# Patient Record
Sex: Female | Born: 1975 | Race: White | Hispanic: No | Marital: Married | Smoking: Current some day smoker
Health system: Southern US, Community
[De-identification: ages and names within clinical notes are randomized; demographics above are authoritative.]

## PROBLEM LIST (undated history)

## (undated) DIAGNOSIS — F329 Major depressive disorder, single episode, unspecified: Secondary | ICD-10-CM

## (undated) DIAGNOSIS — F419 Anxiety disorder, unspecified: Secondary | ICD-10-CM

## (undated) DIAGNOSIS — F32A Depression, unspecified: Secondary | ICD-10-CM

## (undated) DIAGNOSIS — K859 Acute pancreatitis without necrosis or infection, unspecified: Secondary | ICD-10-CM

## (undated) DIAGNOSIS — M419 Scoliosis, unspecified: Secondary | ICD-10-CM

## (undated) DIAGNOSIS — F429 Obsessive-compulsive disorder, unspecified: Secondary | ICD-10-CM

## (undated) DIAGNOSIS — G43909 Migraine, unspecified, not intractable, without status migrainosus: Secondary | ICD-10-CM

## (undated) DIAGNOSIS — F909 Attention-deficit hyperactivity disorder, unspecified type: Secondary | ICD-10-CM

## (undated) HISTORY — PX: TONSILLECTOMY: SUR1361

## (undated) HISTORY — PX: CHOLECYSTECTOMY: SHX55

---

## 1999-12-24 ENCOUNTER — Emergency Department (HOSPITAL_COMMUNITY): Admission: EM | Admit: 1999-12-24 | Discharge: 1999-12-24 | Payer: Self-pay | Admitting: Emergency Medicine

## 2003-10-08 ENCOUNTER — Other Ambulatory Visit: Admission: RE | Admit: 2003-10-08 | Discharge: 2003-10-08 | Payer: Self-pay | Admitting: Obstetrics and Gynecology

## 2004-02-04 ENCOUNTER — Ambulatory Visit (HOSPITAL_COMMUNITY): Admission: RE | Admit: 2004-02-04 | Discharge: 2004-02-04 | Payer: Self-pay | Admitting: Obstetrics and Gynecology

## 2004-04-21 ENCOUNTER — Inpatient Hospital Stay (HOSPITAL_COMMUNITY): Admission: AD | Admit: 2004-04-21 | Discharge: 2004-04-23 | Payer: Self-pay | Admitting: Obstetrics and Gynecology

## 2004-05-23 ENCOUNTER — Other Ambulatory Visit: Admission: RE | Admit: 2004-05-23 | Discharge: 2004-05-23 | Payer: Self-pay | Admitting: Obstetrics and Gynecology

## 2005-05-07 ENCOUNTER — Other Ambulatory Visit: Admission: RE | Admit: 2005-05-07 | Discharge: 2005-05-07 | Payer: Self-pay | Admitting: *Deleted

## 2005-09-30 ENCOUNTER — Encounter: Admission: RE | Admit: 2005-09-30 | Discharge: 2005-12-01 | Payer: Self-pay | Admitting: Family Medicine

## 2006-07-20 ENCOUNTER — Other Ambulatory Visit: Admission: RE | Admit: 2006-07-20 | Discharge: 2006-07-20 | Payer: Self-pay | Admitting: Family Medicine

## 2007-07-29 ENCOUNTER — Other Ambulatory Visit: Admission: RE | Admit: 2007-07-29 | Discharge: 2007-07-29 | Payer: Self-pay | Admitting: Family Medicine

## 2007-10-31 ENCOUNTER — Emergency Department (HOSPITAL_BASED_OUTPATIENT_CLINIC_OR_DEPARTMENT_OTHER): Admission: EM | Admit: 2007-10-31 | Discharge: 2007-10-31 | Payer: Self-pay | Admitting: Emergency Medicine

## 2008-08-07 ENCOUNTER — Other Ambulatory Visit: Admission: RE | Admit: 2008-08-07 | Discharge: 2008-08-07 | Payer: Self-pay | Admitting: Family Medicine

## 2009-05-02 ENCOUNTER — Ambulatory Visit (HOSPITAL_COMMUNITY): Admission: RE | Admit: 2009-05-02 | Discharge: 2009-05-02 | Payer: Self-pay | Admitting: Obstetrics and Gynecology

## 2009-06-11 ENCOUNTER — Encounter: Admission: RE | Admit: 2009-06-11 | Discharge: 2009-07-31 | Payer: Self-pay | Admitting: Obstetrics and Gynecology

## 2009-10-01 ENCOUNTER — Inpatient Hospital Stay (HOSPITAL_COMMUNITY)
Admission: AD | Admit: 2009-10-01 | Discharge: 2009-10-04 | Payer: Self-pay | Source: Home / Self Care | Admitting: Obstetrics and Gynecology

## 2009-11-16 ENCOUNTER — Inpatient Hospital Stay (HOSPITAL_COMMUNITY): Admission: EM | Admit: 2009-11-16 | Discharge: 2009-11-22 | Payer: Self-pay | Admitting: Emergency Medicine

## 2009-11-20 ENCOUNTER — Encounter (INDEPENDENT_AMBULATORY_CARE_PROVIDER_SITE_OTHER): Payer: Self-pay | Admitting: Internal Medicine

## 2009-11-20 ENCOUNTER — Ambulatory Visit: Payer: Self-pay | Admitting: Vascular Surgery

## 2009-11-21 ENCOUNTER — Encounter (INDEPENDENT_AMBULATORY_CARE_PROVIDER_SITE_OTHER): Payer: Self-pay | Admitting: Internal Medicine

## 2010-03-23 NOTE — L&D Delivery Note (Signed)
Preoperative diagnosis: 38+ week IUP, previous cesarean section, in labor, declines VBAC, request permanent sterilization.  Postoperative diagnosis: Same  Procedure: Repeat low transverse cesarean section, tubal ligation by Filshie clip application.  Surgeon: Marcelle Overlie  EBL: 600 cc  Specimens removed: Placenta to labor and delivery  Complications: None  Drains: Foley catheter  Procedure and findings:   The patient was taken to the operating room, after an adequate level of spinal anesthetic was obtained, with the legs in left tilt position the abdomen prepped and draped in the usual manner for sterile bowel procedures. Foley catheter inserted draining clear urine. Transverse incision made tibial scar which was reasonably well healed, this is carried down to the fascia which was incised and extended transversely. Rectus muscles were divided in the midline, peritoneum entered superiorly without incident and extended vertically. Elecsys retractor was positioned for better exposure. Bladder flap was created minimally and the bladder blade was inserted for better visualization transverse incision made in the lower segment extended with bandage scissors the patient then delivered of a healthy infant Apgars 910 the infant was suctioned cord clamped and passed to the pediatric team for further care. Placenta was delivered spontaneously intact, to labor and delivery. Uterus exteriorized, cavity wiped clean with a laparotomy pack closure obtained with the first layer of 0 chromic in a locked fashion followed by an imbricating layer of 0 chromic. The bladder flap area was intact and hemostatic at that point. Bilateral tubes and ovaries were normal, Babcock clamp was then used to grasp each tube it midsegment a Filshie clip was applied a right ankle 2-3 cm from the cornu, completely engulfing the tube. Excellent application on both sides.   Prior to closure sponge, needle, instrument counts reported as correct  x2. Peritoneum closed with a running 2-0 Vicryl suture. 2-0 Vicryl suture to used to reapproximate the rectus muscles in the midline. In a 0 PDS suture was then used to close the fascia from laterally to midline on either side. The subcutaneous tissue was less than 3 cm, was irrigated and noted be hemostatic subcuticular 4-0 Vicryl suture with good skin closure with this pressure dressing applied. She did receive Ancef 1 g IV preop and Pitocin IV after the cord was clamped clear urine noted at the end of the case. Mother and baby doing well at that point.  Dictated by [facility], not proofread.  Ashlee Player M. Milana Obey.D.

## 2010-06-05 LAB — HEPATIC FUNCTION PANEL
ALT: 66 U/L — ABNORMAL HIGH (ref 0–35)
Bilirubin, Direct: 0.2 mg/dL (ref 0.0–0.3)
Total Protein: 6 g/dL (ref 6.0–8.3)

## 2010-06-05 LAB — CBC
MCHC: 33.4 g/dL (ref 30.0–36.0)
MCV: 81.2 fL (ref 78.0–100.0)
Platelets: 271 10*3/uL (ref 150–400)
RBC: 3.55 MIL/uL — ABNORMAL LOW (ref 3.87–5.11)
RDW: 15.6 % — ABNORMAL HIGH (ref 11.5–15.5)
WBC: 7.1 10*3/uL (ref 4.0–10.5)

## 2010-06-05 LAB — DIFFERENTIAL
Basophils Absolute: 0.1 10*3/uL (ref 0.0–0.1)
Monocytes Relative: 4 % (ref 3–12)
Neutrophils Relative %: 66 % (ref 43–77)

## 2010-06-06 LAB — COMPREHENSIVE METABOLIC PANEL
ALT: 224 U/L — ABNORMAL HIGH (ref 0–35)
ALT: 94 U/L — ABNORMAL HIGH (ref 0–35)
AST: 19 U/L (ref 0–37)
AST: 53 U/L — ABNORMAL HIGH (ref 0–37)
Albumin: 2.7 g/dL — ABNORMAL LOW (ref 3.5–5.2)
Albumin: 3.3 g/dL — ABNORMAL LOW (ref 3.5–5.2)
Alkaline Phosphatase: 122 U/L — ABNORMAL HIGH (ref 39–117)
BUN: 13 mg/dL (ref 6–23)
BUN: 5 mg/dL — ABNORMAL LOW (ref 6–23)
BUN: 8 mg/dL (ref 6–23)
CO2: 27 mEq/L (ref 19–32)
Calcium: 8.2 mg/dL — ABNORMAL LOW (ref 8.4–10.5)
Calcium: 8.5 mg/dL (ref 8.4–10.5)
Creatinine, Ser: 0.61 mg/dL (ref 0.4–1.2)
Creatinine, Ser: 0.72 mg/dL (ref 0.4–1.2)
GFR calc Af Amer: >60 mL/min (ref >60)
GFR calc non Af Amer: >60 mL/min (ref >60)
Glucose, Bld: 105 mg/dL — ABNORMAL HIGH (ref 70–99)
Glucose, Bld: 109 mg/dL — ABNORMAL HIGH (ref 70–99)
Glucose, Bld: 90 mg/dL (ref 70–99)
Potassium: 3.3 mEq/L — ABNORMAL LOW (ref 3.5–5.1)
Potassium: 4 mEq/L (ref 3.5–5.1)
Sodium: 138 mEq/L (ref 135–145)
Sodium: 140 mEq/L (ref 135–145)
Total Bilirubin: 4.2 mg/dL — ABNORMAL HIGH (ref 0.3–1.2)
Total Protein: 6.1 g/dL (ref 6.0–8.3)
Total Protein: 6.5 g/dL (ref 6.0–8.3)
Total Protein: 6.6 g/dL (ref 6.0–8.3)

## 2010-06-06 LAB — CBC
HCT: 37.8 % (ref 36.0–46.0)
HCT: 40.4 % (ref 36.0–46.0)
Hemoglobin: 10 g/dL — ABNORMAL LOW (ref 12.0–15.0)
Hemoglobin: 12.8 g/dL (ref 12.0–15.0)
Hemoglobin: 12.8 g/dL (ref 12.0–15.0)
MCH: 27.4 pg (ref 26.0–34.0)
MCH: 27.5 pg (ref 26.0–34.0)
MCH: 27.5 pg (ref 26.0–34.0)
MCH: 27.6 pg (ref 26.0–34.0)
MCHC: 33.5 g/dL (ref 30.0–36.0)
MCHC: 33.5 g/dL (ref 30.0–36.0)
MCHC: 33.8 g/dL (ref 30.0–36.0)
MCHC: 33.9 g/dL (ref 30.0–36.0)
MCHC: 34 g/dL (ref 30.0–36.0)
MCV: 80.4 fL (ref 78.0–100.0)
MCV: 81.8 fL (ref 78.0–100.0)
MCV: 81.8 fL (ref 78.0–100.0)
RBC: 3.67 MIL/uL — ABNORMAL LOW (ref 3.87–5.11)
RBC: 5.53 MIL/uL — ABNORMAL HIGH (ref 3.87–5.11)
RDW: 15.8 % — ABNORMAL HIGH (ref 11.5–15.5)
RDW: 16.1 % — ABNORMAL HIGH (ref 11.5–15.5)
RDW: 16.1 % — ABNORMAL HIGH (ref 11.5–15.5)
WBC: 12.1 10*3/uL — ABNORMAL HIGH (ref 4.0–10.5)
WBC: 13.8 10*3/uL — ABNORMAL HIGH (ref 4.0–10.5)
WBC: 17.5 10*3/uL — ABNORMAL HIGH (ref 4.0–10.5)

## 2010-06-06 LAB — DIFFERENTIAL
Basophils Relative: 0 % (ref 0–1)
Basophils Relative: 1 % (ref 0–1)
Eosinophils Absolute: 0.3 10*3/uL (ref 0.0–0.7)
Eosinophils Absolute: 0.3 10*3/uL (ref 0.0–0.7)
Eosinophils Relative: 5 % (ref 0–5)
Lymphocytes Relative: 9 % — ABNORMAL LOW (ref 12–46)
Lymphs Abs: 1.8 10*3/uL (ref 0.7–4.0)
Monocytes Relative: 5 % (ref 3–12)
Neutro Abs: 15.5 10*3/uL — ABNORMAL HIGH (ref 1.7–7.7)
Neutrophils Relative %: 65 % (ref 43–77)

## 2010-06-06 LAB — URINALYSIS, MICROSCOPIC ONLY
Hgb urine dipstick: NEGATIVE
Ketones, ur: NEGATIVE mg/dL
Leukocytes, UA: NEGATIVE
Protein, ur: NEGATIVE mg/dL
Urobilinogen, UA: 0.2 mg/dL (ref 0.0–1.0)

## 2010-06-06 LAB — POCT I-STAT, CHEM 8
Hemoglobin: 17.3 g/dL — ABNORMAL HIGH (ref 12.0–15.0)
Sodium: 140 mEq/L (ref 135–145)

## 2010-06-06 LAB — CULTURE, BLOOD (ROUTINE X 2): Culture: NO GROWTH

## 2010-06-06 LAB — LIPASE, BLOOD
Lipase: 365 U/L — ABNORMAL HIGH (ref 11–59)
Lipase: 60 U/L — ABNORMAL HIGH (ref 11–59)

## 2010-06-06 LAB — RAPID URINE DRUG SCREEN, HOSP PERFORMED
Amphetamines: NOT DETECTED
Barbiturates: NOT DETECTED
Benzodiazepines: NOT DETECTED
Cocaine: NOT DETECTED
Opiates: POSITIVE — AB
Tetrahydrocannabinol: NOT DETECTED

## 2010-06-06 LAB — URINE CULTURE: Culture  Setup Time: 201108281146

## 2010-06-06 LAB — URINALYSIS, ROUTINE W REFLEX MICROSCOPIC
Glucose, UA: NEGATIVE mg/dL
Hgb urine dipstick: NEGATIVE
Nitrite: NEGATIVE
Protein, ur: NEGATIVE mg/dL
Urobilinogen, UA: 1 mg/dL (ref 0.0–1.0)
pH: 6 (ref 5.0–8.0)

## 2010-06-06 LAB — POCT PREGNANCY, URINE: Preg Test, Ur: NEGATIVE

## 2010-06-06 LAB — HEPATIC FUNCTION PANEL
Albumin: 4 g/dL (ref 3.5–5.2)
Total Protein: 7.8 g/dL (ref 6.0–8.3)

## 2010-06-08 LAB — CBC
HCT: 31.7 % — ABNORMAL LOW (ref 36.0–46.0)
HCT: 35.5 % — ABNORMAL LOW (ref 36.0–46.0)
MCH: 29.1 pg (ref 26.0–34.0)
MCH: 30.1 pg (ref 26.0–34.0)
MCHC: 34.5 g/dL (ref 30.0–36.0)
MCHC: 34.8 g/dL (ref 30.0–36.0)
MCV: 84.6 fL (ref 78.0–100.0)
MCV: 86.3 fL (ref 78.0–100.0)
Platelets: 220 10*3/uL (ref 150–400)
Platelets: 273 10*3/uL (ref 150–400)
RDW: 14.3 % (ref 11.5–15.5)
RDW: 14.8 % (ref 11.5–15.5)
WBC: 11.6 10*3/uL — ABNORMAL HIGH (ref 4.0–10.5)

## 2010-06-08 LAB — RH IMMUNE GLOB WKUP(>/=20WKS)(NOT WOMEN'S HOSP)

## 2010-06-08 LAB — ABO/RH: ABO/RH(D): A NEG

## 2010-07-10 ENCOUNTER — Other Ambulatory Visit (HOSPITAL_COMMUNITY): Payer: Self-pay | Admitting: Obstetrics and Gynecology

## 2010-07-10 DIAGNOSIS — Z0489 Encounter for examination and observation for other specified reasons: Secondary | ICD-10-CM

## 2010-07-16 ENCOUNTER — Ambulatory Visit (HOSPITAL_COMMUNITY): Payer: Self-pay

## 2010-07-21 ENCOUNTER — Ambulatory Visit (HOSPITAL_COMMUNITY)
Admission: RE | Admit: 2010-07-21 | Discharge: 2010-07-21 | Disposition: A | Discharge status: Home / Self Care | Payer: 59 | Source: Ambulatory Visit | Attending: Obstetrics and Gynecology | Admitting: Obstetrics and Gynecology

## 2010-07-21 DIAGNOSIS — E079 Disorder of thyroid, unspecified: Secondary | ICD-10-CM | POA: Insufficient documentation

## 2010-07-21 DIAGNOSIS — Z0489 Encounter for examination and observation for other specified reasons: Secondary | ICD-10-CM

## 2010-07-21 DIAGNOSIS — Z1389 Encounter for screening for other disorder: Secondary | ICD-10-CM | POA: Insufficient documentation

## 2010-07-21 DIAGNOSIS — O358XX0 Maternal care for other (suspected) fetal abnormality and damage, not applicable or unspecified: Secondary | ICD-10-CM | POA: Insufficient documentation

## 2010-07-21 DIAGNOSIS — O34219 Maternal care for unspecified type scar from previous cesarean delivery: Secondary | ICD-10-CM | POA: Insufficient documentation

## 2010-07-21 DIAGNOSIS — E039 Hypothyroidism, unspecified: Secondary | ICD-10-CM | POA: Insufficient documentation

## 2010-07-21 DIAGNOSIS — Z363 Encounter for antenatal screening for malformations: Secondary | ICD-10-CM | POA: Insufficient documentation

## 2010-11-05 ENCOUNTER — Inpatient Hospital Stay (HOSPITAL_COMMUNITY): Payer: 59 | Admitting: Anesthesiology

## 2010-11-05 ENCOUNTER — Inpatient Hospital Stay (HOSPITAL_COMMUNITY)
Admission: AD | Admit: 2010-11-05 | Discharge: 2010-11-08 | DRG: 766 | Disposition: A | Discharge status: Home / Self Care | Payer: 59 | Source: Ambulatory Visit | Attending: Obstetrics and Gynecology | Admitting: Obstetrics and Gynecology

## 2010-11-05 ENCOUNTER — Encounter (HOSPITAL_COMMUNITY)
Admission: AD | Disposition: A | Discharge status: Home / Self Care | Payer: Self-pay | Source: Ambulatory Visit | Attending: Obstetrics and Gynecology

## 2010-11-05 ENCOUNTER — Encounter (HOSPITAL_COMMUNITY): Payer: Self-pay | Admitting: Anesthesiology

## 2010-11-05 ENCOUNTER — Encounter (HOSPITAL_COMMUNITY): Payer: Self-pay | Admitting: *Deleted

## 2010-11-05 DIAGNOSIS — M545 Low back pain: Secondary | ICD-10-CM

## 2010-11-05 DIAGNOSIS — Z302 Encounter for sterilization: Secondary | ICD-10-CM

## 2010-11-05 DIAGNOSIS — O34219 Maternal care for unspecified type scar from previous cesarean delivery: Principal | ICD-10-CM | POA: Diagnosis present

## 2010-11-05 HISTORY — PX: TUBAL LIGATION: SHX77

## 2010-11-05 LAB — CBC
HCT: 37 % (ref 36.0–46.0)
Hemoglobin: 11.7 g/dL — ABNORMAL LOW (ref 12.0–15.0)
MCHC: 31.6 g/dL (ref 30.0–36.0)
RBC: 4.5 MIL/uL (ref 3.87–5.11)
WBC: 10.6 10*3/uL — ABNORMAL HIGH (ref 4.0–10.5)

## 2010-11-05 SURGERY — Surgical Case
Anesthesia: Regional | Site: Abdomen

## 2010-11-05 MED ORDER — KETOROLAC TROMETHAMINE 60 MG/2ML IM SOLN
INTRAMUSCULAR | Status: AC
  Administered 2010-11-05: 60 mg via INTRAMUSCULAR
  Filled 2010-11-05: qty 2

## 2010-11-05 MED ORDER — SIMETHICONE 80 MG PO CHEW: 80.0000 mg | CHEWABLE_TABLET | ORAL | Status: DC | PRN

## 2010-11-05 MED ORDER — DIPHENHYDRAMINE HCL 25 MG PO CAPS: 25.0000 mg | ORAL_CAPSULE | ORAL | Status: DC | PRN

## 2010-11-05 MED ORDER — TETANUS-DIPHTH-ACELL PERTUSSIS 5-2.5-18.5 LF-MCG/0.5 IM SUSP
0.5000 mL | Freq: Once | INTRAMUSCULAR | Status: AC
  Administered 2010-11-06: 0.5 mL via INTRAMUSCULAR
  Filled 2010-11-05: qty 0.5

## 2010-11-05 MED ORDER — BISACODYL 10 MG RE SUPP: 10.0000 mg | Freq: Every day | RECTAL | Status: DC | PRN

## 2010-11-05 MED ORDER — OXYCODONE-ACETAMINOPHEN 5-325 MG PO TABS
1.0000 | ORAL_TABLET | Freq: Four times a day (QID) | ORAL | Status: DC | PRN
  Administered 2010-11-05 – 2010-11-06 (×3): 2 via ORAL
  Administered 2010-11-06: 1 via ORAL
  Filled 2010-11-05 (×4): qty 2

## 2010-11-05 MED ORDER — WITCH HAZEL-GLYCERIN EX PADS: 1.0000 "application " | MEDICATED_PAD | CUTANEOUS | Status: DC | PRN

## 2010-11-05 MED ORDER — KETOROLAC TROMETHAMINE 30 MG/ML IJ SOLN
30.0000 mg | Freq: Four times a day (QID) | INTRAMUSCULAR | Status: DC | PRN
  Filled 2010-11-05: qty 1

## 2010-11-05 MED ORDER — HYDROMORPHONE HCL 1 MG/ML IJ SOLN
0.5000 mg | Freq: Once | INTRAMUSCULAR | Status: AC
  Administered 2010-11-05: 0.5 mg via INTRAVENOUS

## 2010-11-05 MED ORDER — FAMOTIDINE IN NACL 20-0.9 MG/50ML-% IV SOLN
20.0000 mg | Freq: Once | INTRAVENOUS | Status: AC
  Administered 2010-11-05: 20 mg via INTRAVENOUS
  Filled 2010-11-05: qty 50

## 2010-11-05 MED ORDER — MEPERIDINE HCL 25 MG/ML IJ SOLN: 6.2500 mg | INTRAMUSCULAR | Status: DC | PRN

## 2010-11-05 MED ORDER — PRENATAL PLUS 27-1 MG PO TABS
1.0000 | ORAL_TABLET | Freq: Every day | ORAL | Status: DC
  Administered 2010-11-06 – 2010-11-08 (×3): 1 via ORAL
  Filled 2010-11-05 (×3): qty 1

## 2010-11-05 MED ORDER — MEASLES, MUMPS & RUBELLA VAC ~~LOC~~ INJ
0.5000 mL | INJECTION | Freq: Once | SUBCUTANEOUS | Status: DC
  Filled 2010-11-05: qty 0.5

## 2010-11-05 MED ORDER — DIPHENHYDRAMINE HCL 50 MG/ML IJ SOLN: 12.5000 mg | INTRAMUSCULAR | Status: DC | PRN

## 2010-11-05 MED ORDER — NALOXONE HCL 0.4 MG/ML IJ SOLN: 0.4000 mg | INTRAMUSCULAR | Status: DC | PRN

## 2010-11-05 MED ORDER — KETOROLAC TROMETHAMINE 30 MG/ML IJ SOLN
30.0000 mg | Freq: Four times a day (QID) | INTRAMUSCULAR | Status: DC | PRN
  Administered 2010-11-05 (×2): 30 mg via INTRAVENOUS
  Filled 2010-11-05: qty 1

## 2010-11-05 MED ORDER — SCOPOLAMINE 1 MG/3DAYS TD PT72
1.0000 | MEDICATED_PATCH | Freq: Once | TRANSDERMAL | Status: AC
  Administered 2010-11-05: 1.5 mg via TRANSDERMAL

## 2010-11-05 MED ORDER — METOCLOPRAMIDE HCL 5 MG/ML IJ SOLN: 10.0000 mg | Freq: Three times a day (TID) | INTRAMUSCULAR | Status: DC | PRN

## 2010-11-05 MED ORDER — OXYTOCIN 20 UNITS IN LACTATED RINGERS INFUSION - SIMPLE
125.0000 mL/h | INTRAVENOUS | Status: AC
  Filled 2010-11-05: qty 1000

## 2010-11-05 MED ORDER — SODIUM CHLORIDE 0.9 % IV SOLN
1.0000 ug/kg/h | INTRAVENOUS | Status: DC | PRN
  Filled 2010-11-05: qty 2.5

## 2010-11-05 MED ORDER — EPHEDRINE SULFATE 50 MG/ML IJ SOLN
INTRAMUSCULAR | Status: DC | PRN
  Administered 2010-11-05: 20 mg via INTRAVENOUS
  Administered 2010-11-05: 10 mg via INTRAVENOUS
  Administered 2010-11-05: 15 mg via INTRAVENOUS

## 2010-11-05 MED ORDER — IBUPROFEN 600 MG PO TABS: 600.0000 mg | ORAL_TABLET | Freq: Four times a day (QID) | ORAL | Status: DC | PRN

## 2010-11-05 MED ORDER — NALBUPHINE SYRINGE 5 MG/0.5 ML
5.0000 mg | INJECTION | INTRAMUSCULAR | Status: DC | PRN
  Filled 2010-11-05: qty 1

## 2010-11-05 MED ORDER — FLEET ENEMA 7-19 GM/118ML RE ENEM: 1.0000 | ENEMA | RECTAL | Status: DC | PRN

## 2010-11-05 MED ORDER — OXYTOCIN 20 UNITS IN LACTATED RINGERS INFUSION - SIMPLE
INTRAVENOUS | Status: DC | PRN
  Administered 2010-11-05 (×2): 20 [IU] via INTRAVENOUS

## 2010-11-05 MED ORDER — ZOLPIDEM TARTRATE 5 MG PO TABS: 5.0000 mg | ORAL_TABLET | Freq: Every evening | ORAL | Status: DC | PRN

## 2010-11-05 MED ORDER — DIPHENHYDRAMINE HCL 50 MG/ML IJ SOLN: 25.0000 mg | INTRAMUSCULAR | Status: DC | PRN

## 2010-11-05 MED ORDER — MEPERIDINE HCL 25 MG/ML IJ SOLN
INTRAMUSCULAR | Status: DC | PRN
  Administered 2010-11-05: 25 mg via INTRAVENOUS

## 2010-11-05 MED ORDER — ONDANSETRON HCL 4 MG/2ML IJ SOLN: 4.0000 mg | Freq: Three times a day (TID) | INTRAMUSCULAR | Status: DC | PRN

## 2010-11-05 MED ORDER — SCOPOLAMINE 1 MG/3DAYS TD PT72
MEDICATED_PATCH | TRANSDERMAL | Status: AC
  Administered 2010-11-05: 1.5 mg via TRANSDERMAL
  Filled 2010-11-05: qty 1

## 2010-11-05 MED ORDER — CITRIC ACID-SODIUM CITRATE 334-500 MG/5ML PO SOLN
30.0000 mL | Freq: Once | ORAL | Status: AC
  Administered 2010-11-05: 30 mL via ORAL
  Filled 2010-11-05: qty 15

## 2010-11-05 MED ORDER — LANOLIN HYDROUS EX OINT: 1.0000 "application " | TOPICAL_OINTMENT | CUTANEOUS | Status: DC | PRN

## 2010-11-05 MED ORDER — KETOROLAC TROMETHAMINE 60 MG/2ML IM SOLN
60.0000 mg | Freq: Once | INTRAMUSCULAR | Status: AC | PRN
  Administered 2010-11-05: 60 mg via INTRAMUSCULAR

## 2010-11-05 MED ORDER — FENTANYL CITRATE 0.05 MG/ML IJ SOLN
INTRAMUSCULAR | Status: DC | PRN
  Administered 2010-11-05: 25 ug via INTRATHECAL

## 2010-11-05 MED ORDER — SODIUM CHLORIDE 0.9 % IJ SOLN: 3.0000 mL | INTRAMUSCULAR | Status: DC | PRN

## 2010-11-05 MED ORDER — SIMETHICONE 80 MG PO CHEW
80.0000 mg | CHEWABLE_TABLET | Freq: Three times a day (TID) | ORAL | Status: DC
  Administered 2010-11-05 – 2010-11-08 (×10): 80 mg via ORAL

## 2010-11-05 MED ORDER — DIBUCAINE 1 % RE OINT: 1.0000 "application " | TOPICAL_OINTMENT | RECTAL | Status: DC | PRN

## 2010-11-05 MED ORDER — CEFAZOLIN SODIUM 1-5 GM-% IV SOLN
1.0000 g | INTRAVENOUS | Status: AC
  Administered 2010-11-05: 1 g via INTRAVENOUS
  Filled 2010-11-05: qty 50

## 2010-11-05 MED ORDER — MORPHINE SULFATE (PF) 0.5 MG/ML IJ SOLN
INTRAMUSCULAR | Status: DC | PRN
  Administered 2010-11-05: 150 ug via INTRATHECAL

## 2010-11-05 MED ORDER — SENNOSIDES-DOCUSATE SODIUM 8.6-50 MG PO TABS
2.0000 | ORAL_TABLET | Freq: Every day | ORAL | Status: DC
  Administered 2010-11-05 – 2010-11-07 (×3): 2 via ORAL

## 2010-11-05 MED ORDER — PHENYLEPHRINE HCL 10 MG/ML IJ SOLN: INTRAMUSCULAR | Status: DC | PRN

## 2010-11-05 MED ORDER — LACTATED RINGERS IV SOLN
INTRAVENOUS | Status: DC
  Administered 2010-11-05 (×5): via INTRAVENOUS

## 2010-11-05 MED ORDER — ACETAMINOPHEN 10 MG/ML IV SOLN
1000.0000 mg | Freq: Four times a day (QID) | INTRAVENOUS | Status: DC | PRN
  Administered 2010-11-05: 1000 mg via INTRAVENOUS
  Filled 2010-11-05: qty 100

## 2010-11-05 MED ORDER — MENTHOL 3 MG MT LOZG: 1.0000 | LOZENGE | OROMUCOSAL | Status: DC | PRN

## 2010-11-05 MED ORDER — ONDANSETRON HCL 4 MG/2ML IJ SOLN
INTRAMUSCULAR | Status: DC | PRN
  Administered 2010-11-05: 4 mg via INTRAVENOUS

## 2010-11-05 MED ORDER — HYDROMORPHONE HCL 1 MG/ML IJ SOLN
INTRAMUSCULAR | Status: AC
  Administered 2010-11-05: 0.5 mg via INTRAVENOUS
  Filled 2010-11-05: qty 1

## 2010-11-05 MED ORDER — HYDROMORPHONE HCL 1 MG/ML IJ SOLN
0.5000 mg | Freq: Once | INTRAMUSCULAR | Status: AC
  Administered 2010-11-05: 0.5 mg via INTRAVENOUS
  Filled 2010-11-05: qty 1

## 2010-11-05 SURGICAL SUPPLY — 24 items
CLIP FILSHIE TUBAL LIGA STRL (Clip) ×1 IMPLANT
CLOTH BEACON ORANGE TIMEOUT ST (SAFETY) ×3 IMPLANT
DRESSING TELFA 8X3 (GAUZE/BANDAGES/DRESSINGS) IMPLANT
ELECT REM PT RETURN 9FT ADLT (ELECTROSURGICAL) ×3
ELECTRODE REM PT RTRN 9FT ADLT (ELECTROSURGICAL) ×2 IMPLANT
EXTRACTOR VACUUM M CUP 4 TUBE (SUCTIONS) IMPLANT
GAUZE SPONGE 4X4 12PLY STRL LF (GAUZE/BANDAGES/DRESSINGS) ×6 IMPLANT
GLOVE BIO SURGEON STRL SZ7 (GLOVE) ×6 IMPLANT
GOWN PREVENTION PLUS LG XLONG (DISPOSABLE) ×9 IMPLANT
KIT ABG SYR 3ML LUER SLIP (SYRINGE) IMPLANT
NDL HYPO 25X5/8 SAFETYGLIDE (NEEDLE) ×2 IMPLANT
NEEDLE HYPO 25X5/8 SAFETYGLIDE (NEEDLE) ×3 IMPLANT
NS IRRIG 1000ML POUR BTL (IV SOLUTION) ×3 IMPLANT
PACK C SECTION WH (CUSTOM PROCEDURE TRAY) ×3 IMPLANT
PAD ABD 7.5X8 STRL (GAUZE/BANDAGES/DRESSINGS) IMPLANT
SLEEVE SCD COMPRESS KNEE MED (MISCELLANEOUS) IMPLANT
SUT CHROMIC 0 CTX 36 (SUTURE) ×9 IMPLANT
SUT MON AB 4-0 PS1 27 (SUTURE) IMPLANT
SUT PDS AB 0 CT1 27 (SUTURE) ×6 IMPLANT
SUT VIC AB 3-0 CT1 27 (SUTURE) ×6
SUT VIC AB 3-0 CT1 TAPERPNT 27 (SUTURE) ×4 IMPLANT
TOWEL OR 17X24 6PK STRL BLUE (TOWEL DISPOSABLE) ×6 IMPLANT
TRAY FOLEY CATH 14FR (SET/KITS/TRAYS/PACK) ×3 IMPLANT
WATER STERILE IRR 1000ML POUR (IV SOLUTION) ×3 IMPLANT

## 2010-11-05 NOTE — Transfer of Care (Signed)
Immediate Anesthesia Transfer of Care Note  Patient: Katelyn Gray  Procedure(s) Performed:  CESAREAN SECTION; BILATERAL TUBAL LIGATION  Patient Location: PACU  Anesthesia Type: Spinal  Level of Consciousness: awake, alert  and oriented  Airway & Oxygen Therapy: Patient Spontanous Breathing  Post-op Assessment: Report given to PACU RN and Post -op Vital signs reviewed and stable  Post vital signs: Reviewed and stable  Complications: No apparent anesthesia complications

## 2010-11-05 NOTE — Anesthesia Postprocedure Evaluation (Signed)
  Anesthesia Post-op Note  Patient: Katelyn Gray  Procedure(s) Performed:  CESAREAN SECTION; BILATERAL TUBAL LIGATION  Patient Location: PACU  Anesthesia Type: Spinal  Level of Consciousness: awake, alert  and oriented  Airway and Oxygen Therapy: Patient Spontanous Breathing  Post-op Pain: 3 /10  Post-op Assessment: Post-op Vital signs reviewed, Patient's Cardiovascular Status Stable, Respiratory Function Stable, Patent Airway, No signs of Nausea or vomiting, Pain level controlled, No headache, No backache, No residual numbness and No residual motor weakness  Post-op Vital Signs: Reviewed and stable  Complications: No apparent anesthesia complications

## 2010-11-05 NOTE — H&P (Signed)
Nell Range  DICTATION # [id number] CSN# [id number]   Meriel Pica, MD 11/05/2010 4:51 AM

## 2010-11-05 NOTE — Progress Notes (Signed)
Difficulty tracing baby as pt continues to rock in bed.

## 2010-11-05 NOTE — Progress Notes (Signed)
Pt to OR via stretcher at this time

## 2010-11-05 NOTE — Progress Notes (Signed)
Pt presents to [facility] for labor check. Pt is scheduled for c/s on 8/21.

## 2010-11-05 NOTE — Progress Notes (Signed)
Addended by: Brayton Caves on: 11/05/2010 10:24 AM   Modules accepted: Orders

## 2010-11-05 NOTE — Progress Notes (Signed)
Dr. Marcelle Overlie at bedside.  Assessment done.  poc discussed with pt.

## 2010-11-05 NOTE — Anesthesia Preprocedure Evaluation (Addendum)
Anesthesia Evaluation  Name, MR# and DOB Patient awake  General Assessment Comment  Reviewed: Allergy & Precautions, H&P , Patient's Chart, lab work & pertinent test results  Airway Mallampati: II TM Distance: >3 FB Neck ROM: full    Dental No notable dental hx.    Pulmonary  Asthma: no recent hx of sx.  clear to auscultation  pulmonary exam normalPulmonary Exam Normal breath sounds clear to auscultation none    Cardiovascular Exercise Tolerance: Good regular Normal    Neuro/Psych   GI/Hepatic/Renal   Endo/Other  (+)   Morbid obesity  Abdominal   Musculoskeletal   Hematology   Peds  Reproductive/Obstetrics    Anesthesia Other Findings            Anesthesia Physical Anesthesia Plan  ASA: III  Anesthesia Plan: Spinal   Post-op Pain Management:    Induction:   Airway Management Planned:   Additional Equipment:   Intra-op Plan:   Post-operative Plan:   Informed Consent: I have reviewed the patients History and Physical, chart, labs and discussed the procedure including the risks, benefits and alternatives for the proposed anesthesia with the patient or authorized representative who has indicated his/her understanding and acceptance.   Dental Advisory Given  Plan Discussed with: CRNA  Anesthesia Plan Comments: (Lab work confirmed with CRNA in room. Platelets okay. Discussed spinal anesthetic, and patient consents to the procedure:  included risk of possible headache,backache, failed block, allergic reaction, and nerve injury. This patient was asked if she had any questions or concerns before the procedure started. )        Anesthesia Quick Evaluation

## 2010-11-05 NOTE — Anesthesia Procedure Notes (Signed)
Spinal Block  Staffing Anesthesiologist: Jiles Garter Additional Notes Spinal Dosage in OR  Bupivicaine ml       1.4 PFMS04   mcg        150 Fentanyl mcg            20

## 2010-11-05 NOTE — Progress Notes (Signed)
Dr. Marcelle Overlie notified of pt presenting for labor and a repeat c/s.  Notified of VE and fetal strip.  Orders received for IV, CBC and sign permit for c/s.

## 2010-11-06 ENCOUNTER — Other Ambulatory Visit (HOSPITAL_COMMUNITY): Payer: 59

## 2010-11-06 LAB — CBC
HCT: 29.2 % — ABNORMAL LOW (ref 36.0–46.0)
MCHC: 32.2 g/dL (ref 30.0–36.0)
Platelets: 212 10*3/uL (ref 150–400)
RDW: 16 % — ABNORMAL HIGH (ref 11.5–15.5)

## 2010-11-06 LAB — RH IG WORKUP (INCLUDES ABO/RH)
ABO/RH(D): A NEG
Unit division: 0

## 2010-11-06 MED ORDER — OXYCODONE-ACETAMINOPHEN 5-325 MG PO TABS
1.0000 | ORAL_TABLET | ORAL | Status: DC | PRN
Start: 2010-11-06 — End: 2010-11-08
  Administered 2010-11-06 – 2010-11-08 (×10): 2 via ORAL
  Filled 2010-11-06 (×10): qty 2

## 2010-11-06 MED ORDER — HYDROMORPHONE HCL 2 MG PO TABS
2.0000 mg | ORAL_TABLET | ORAL | Status: DC | PRN
  Administered 2010-11-06 (×3): 2 mg via ORAL
  Filled 2010-11-06 (×3): qty 1

## 2010-11-06 MED ORDER — IBUPROFEN 800 MG PO TABS
800.0000 mg | ORAL_TABLET | Freq: Three times a day (TID) | ORAL | Status: DC | PRN
  Administered 2010-11-06 – 2010-11-07 (×5): 800 mg via ORAL
  Filled 2010-11-06 (×6): qty 1

## 2010-11-06 MED ORDER — DIPHENHYDRAMINE HCL 25 MG PO CAPS: 25.0000 mg | ORAL_CAPSULE | Freq: Four times a day (QID) | ORAL | Status: DC | PRN

## 2010-11-06 NOTE — Progress Notes (Signed)
Subjective: Postpartum Day 1: Cesarean Delivery Patient reports incisional pain.  Has taken 2 Percocet and Motrin 800 without relief. Has taken Dilaudid with cholecystectomy previously with good control of pain.  Also complains of pain at epidural site  Objective: Vital signs in last 24 hours: Temp:  [97.4 F (36.3 C)-98.5 F (36.9 C)] 98.1 F (36.7 C) (08/16 0354) Pulse Rate:  [58-79] 79  (08/16 0354) Resp:  [18-23] 20  (08/16 0354) BP: (92-120)/(59-74) 103/71 mmHg (08/16 0354) SpO2:  [96 %-99 %] 97 % (08/16 0601)  Physical Exam:  General: cooperative, mild distress and mildly obese Lochia: appropriate Uterine Fundus: firm Abd dressing CDI, + BS Foley discontinued, voiding qs DVT Evaluation: No evidence of DVT seen on physical exam. No evidence of ecchymosis, erythema or edema at epidural site   [facility] 11/06/10 0540 11/05/10 0430  HGB 9.4* 11.7*  HCT 29.2* 37.0    Assessment/Plan: Status post Cesarean section. Postoperative pain  Continue current care Change pain med to Dilaudid 2 mg Ordered K-pad for back May shower, remove dressing.  Amaia Lavallie G 11/06/2010, 8:25 AM

## 2010-11-07 NOTE — Plan of Care (Signed)
Problem: Discharge Progression Outcomes Goal: MMR given as ordered Outcome: Progressing Awaiting rubella results

## 2010-11-07 NOTE — Progress Notes (Signed)
Subjective: Postpartum Day 2: Cesarean Delivery Patient reports tolerating PO, + flatus and no problems voiding.    Objective: Vital signs in last 24 hours: Temp:  [97.8 F (36.6 C)-98.1 F (36.7 C)] 97.8 F (36.6 C) (08/17 0500) Pulse Rate:  [76-87] 80  (08/17 0500) Resp:  [18-19] 18  (08/17 0500) BP: (104-110)/(69-75) 107/72 mmHg (08/17 0500)  Physical Exam:  General: alert and cooperative Lochia: appropriate Uterine Fundus: firm Incision: healing well, no significant erythema DVT Evaluation: No evidence of DVT seen on physical exam.   [facility] 11/06/10 0540 11/05/10 0430  HGB 9.4* 11.7*  HCT 29.2* 37.0    Assessment/Plan: Status post Cesarean section. Doing well postoperatively.  Continue current care.  Kolter Reaver G 11/07/2010, 8:27 AM

## 2010-11-08 MED ORDER — IBUPROFEN 800 MG PO TABS: 800.0000 mg | ORAL_TABLET | Freq: Three times a day (TID) | ORAL | Status: AC | PRN

## 2010-11-08 MED ORDER — OXYCODONE-ACETAMINOPHEN 5-325 MG PO TABS: 1.0000 | ORAL_TABLET | ORAL | Status: AC | PRN

## 2010-11-08 NOTE — Progress Notes (Signed)
Subjective: Postpartum Day 3: Cesarean Delivery Patient reports tolerating PO, + flatus and no problems voiding.    Objective: Vital signs in last 24 hours: Temp:  [98 F (36.7 C)-98.3 F (36.8 C)] 98 F (36.7 C) (08/18 0520) Pulse Rate:  [80-82] 80  (08/18 0520) Resp:  [18] 18  (08/18 0520) BP: (100-109)/(67-73) 100/67 mmHg (08/18 0520)  Physical Exam:  General: alert and cooperative Lochia: appropriate Uterine Fundus: firm Incision: healing well DVT Evaluation: No evidence of DVT seen on physical exam.   Basename 11/06/10 0540  HGB 9.4*  HCT 29.2*    Assessment/Plan: Status post Cesarean section.   Discharge home with standard precautions and return to clinic in 4-6 weeks.  Hallel Denherder II,Rowena Moilanen E 11/08/2010, 7:57 AM

## 2010-11-08 NOTE — Discharge Summary (Signed)
Obstetric Discharge Summary Reason for Admission: cesarean section Prenatal Procedures: none Intrapartum Procedures: cesarean: low cervical, transverse Postpartum Procedures: none Complications-Operative and Postpartum: none Hemoglobin  Date Value Range Status  11/06/2010 9.4* 12.0-15.0 (g/dL) Final     DELTA CHECK NOTED     REPEATED TO VERIFY     HCT  Date Value Range Status  11/06/2010 29.2* 36.0-46.0 (%) Final    Discharge Diagnoses: Term Pregnancy-delivered  Discharge Information: Date: 11/08/2010 Activity: pelvic rest Diet: routine Medications: PNV, Ibuprophen, Iron and Percocet Condition: stable Instructions: refer to practice specific booklet Discharge to: home Follow-up Information    Follow up with [facility]. Make an appointment in 2 weeks.   Contact information:   [address] [place] [address] [phone]          Newborn Data: Live born female  Birth Weight: 6 lb 8.9 oz (2975 g) APGAR: 8, 10  Home with mother.  Kera Deacon II,Laquincy Eastridge E 11/08/2010, 8:06 AM

## 2010-11-10 NOTE — H&P (Signed)
NAME:  KARLY, PITTER NO.:  MEDICAL RECORD NO.:  [id number]  LOCATION:                                 FACILITY:  PHYSICIAN:  Duke Salvia. Marcelle Overlie, M.D.DATE OF BIRTH:  10/24/1982  DATE OF ADMISSION: DATE OF DISCHARGE:                             HISTORY & PHYSICAL   CHIEF COMPLAINT:  Scheduled repeat cesarean section, in labor.  HISTORY OF PRESENT ILLNESS:  A 35 year old G40, P 3-0-2-3, EDD is November 18, 2010.  She has had one prior section for breech presentation, prior to that she has had two vaginal deliveries.  She is scheduled for RCS and tubal later this month, presents in early labor.  She is sure she does not want to try labor.  The procedure of repeat cesarean section, tubal ligation including risks related to bleeding, infection, transfusion, wound infection, phlebitis, permanence of tubal procedure and failure rated 2-3 per thousand all discussed with her which she understands and accepts.  Her history is significant for being Rh negative for history of anxiety and depression, history of hypothyroidism not currently on medications. Her prenatal thyroid labs have been normal.  GBS was negative.  PAST MEDICAL HISTORY:  Please see her [place] form for details.  ALLERGIES:  IMITREX.  PHYSICAL EXAMINATION:  VITAL SIGNS:  Temperature 98.2, blood pressure 120/78. HEENT: Unremarkable. NECK:  Supple without masses. LUNGS:  Clear. CARDIOVASCULAR:  Regular rate and rhythm without murmurs, rubs, gallops. BREASTS:  Not examined.  Term fundal height.  Fetal heart rate was 140. Cervix was 350, vertex -2, membranes intact.  EXTREMITIES: Unremarkable. NEUROLOGIC:  Unremarkable.  IMPRESSION: 1. Term intrauterine pregnancy, labor. 2. Prior cesarean section, declines vaginal birth after cesarean     section.  PLAN:  Repeat cesarean section and tubal ligation.  Procedure and risks reviewed as above.     Ahna Konkle M. Marcelle Overlie, M.D.     [facility]MED[facility]   D:  11/05/2010  T:  11/05/2010  Job:  [id number]

## 2010-11-11 ENCOUNTER — Inpatient Hospital Stay (HOSPITAL_COMMUNITY): Admission: RE | Admit: 2010-11-11 | Payer: 59 | Source: Ambulatory Visit | Admitting: Obstetrics and Gynecology

## 2010-11-11 ENCOUNTER — Encounter (HOSPITAL_COMMUNITY): Admission: RE | Payer: Self-pay | Source: Ambulatory Visit

## 2010-11-11 SURGERY — Surgical Case
Anesthesia: Regional | Laterality: Bilateral

## 2010-12-01 ENCOUNTER — Encounter (HOSPITAL_COMMUNITY): Payer: Self-pay | Admitting: Obstetrics and Gynecology

## 2011-02-24 ENCOUNTER — Encounter (HOSPITAL_BASED_OUTPATIENT_CLINIC_OR_DEPARTMENT_OTHER): Payer: Self-pay

## 2011-02-24 ENCOUNTER — Emergency Department (HOSPITAL_BASED_OUTPATIENT_CLINIC_OR_DEPARTMENT_OTHER)
Admission: EM | Admit: 2011-02-24 | Discharge: 2011-02-24 | Disposition: A | Discharge status: Home / Self Care | Payer: Medicaid Other | Attending: Emergency Medicine | Admitting: Emergency Medicine

## 2011-02-24 DIAGNOSIS — M25569 Pain in unspecified knee: Secondary | ICD-10-CM | POA: Insufficient documentation

## 2011-02-24 DIAGNOSIS — J45909 Unspecified asthma, uncomplicated: Secondary | ICD-10-CM | POA: Insufficient documentation

## 2011-02-24 DIAGNOSIS — R269 Unspecified abnormalities of gait and mobility: Secondary | ICD-10-CM | POA: Insufficient documentation

## 2011-02-24 DIAGNOSIS — M549 Dorsalgia, unspecified: Secondary | ICD-10-CM | POA: Insufficient documentation

## 2011-02-24 DIAGNOSIS — M25562 Pain in left knee: Secondary | ICD-10-CM

## 2011-02-24 HISTORY — DX: Acute pancreatitis without necrosis or infection, unspecified: K85.90

## 2011-02-24 HISTORY — DX: Scoliosis, unspecified: M41.9

## 2011-02-24 MED ORDER — HYDROCODONE-ACETAMINOPHEN 5-500 MG PO TABS: 1.0000 | ORAL_TABLET | ORAL | Status: AC | PRN

## 2011-02-24 MED ORDER — IBUPROFEN 400 MG PO TABS
600.0000 mg | ORAL_TABLET | Freq: Once | ORAL | Status: AC
  Administered 2011-02-24: 600 mg via ORAL
  Filled 2011-02-24: qty 1

## 2011-02-24 MED ORDER — OXYCODONE-ACETAMINOPHEN 5-325 MG PO TABS
1.0000 | ORAL_TABLET | Freq: Once | ORAL | Status: DC
  Filled 2011-02-24: qty 1

## 2011-02-24 NOTE — ED Notes (Signed)
MD at bedside. 

## 2011-02-24 NOTE — ED Provider Notes (Signed)
I saw and evaluated the patient, reviewed the resident's note and I agree with the findings and plan.  Suspect musculoskeletal and arthritic pain.  Recommended weight loss and pain medicine and close follow up with her primary care physician  Lyanne Co, MD 02/24/11 1535

## 2011-02-24 NOTE — ED Notes (Signed)
Pt reports low back pain and left knee pain that started Thursday.  Symptoms are unrelieved after taking Flexeril.

## 2011-02-24 NOTE — ED Provider Notes (Signed)
History     CSN: [id number] Arrival date & time: 02/24/2011  8:12 AM   First MD Initiated Contact with Patient 02/24/11 0801      Chief Complaint  Patient presents with  . Back Pain  . Knee Pain    (Consider location/radiation/quality/duration/timing/severity/associated sxs/prior treatment) Patient is a 35 y.o. female presenting with back pain and knee pain.  Back Pain  Pertinent negatives include no chest pain, no fever, no numbness, no headaches, no abdominal pain, no dysuria and no weakness.  Knee Pain Associated symptoms include arthralgias. Pertinent negatives include no abdominal pain, chest pain, chills, congestion, diaphoresis, fatigue, fever, headaches, joint swelling, myalgias, nausea, neck pain, numbness or weakness.    Katelyn Gray is a 35 year old woman with past medical history significant for chronic low back and left knee pain who presents with exacerbation of her osteoarthritis pain.  Katelyn Gray states that she has had a long battle with back and knee pain beginning in her teens. She states that she has been seen by [facility] who are her primary care doctors, but there is a problem with her Medicaid and she is unable to see them until the first of the year. She's not feel that her back pain and is an urgent issue however she does want to be seen prior to the first of the year. Katelyn Gray states that her current pain flare began on Thursday when she awoke after taking Flexeril the night before. She describes it as being located in her bilateral lower back. The duration has been on a daily basis with occasional flares. Character is aching pain with occasional electric shock radiations down her left buttock. It is aggravated by standing and walking. It is relieved by Flexeril and naproxen. Current severity is 0/10 while sitting. It is 7/10 while walking and 9/10 at its peak. Denies any red hot swollen joints. Denies any fevers or chills. Denies any weakness,  paresthesias, or numbness.    Past Medical History  Diagnosis Date  . Asthma   . Scoliosis   . Pancreatitis     Past Surgical History  Procedure Date  . Cesarean section   . Cholecystectomy   . Cesarean section 11/05/2010    Procedure: CESAREAN SECTION;  Surgeon: Meriel Pica;  Location: [facility];  Service: Gynecology;  Laterality: N/A;  . Tubal ligation 11/05/2010    Procedure: BILATERAL TUBAL LIGATION;  Surgeon: Meriel Pica;  Location: [facility];  Service: Gynecology;  Laterality: Bilateral;  . Tonsillectomy     No family history on file.  History  Substance Use Topics  . Smoking status: Never Smoker   . Smokeless tobacco: Not on file  . Alcohol Use: No    OB History    Grav Para Term Preterm Abortions TAB SAB Ect Mult Living   7 4 4  2 2    4       Review of Systems  Constitutional: Negative for fever, chills, diaphoresis, activity change and fatigue.  HENT: Negative for congestion, neck pain and neck stiffness.   Eyes: Negative for photophobia.  Respiratory: Negative for chest tightness and shortness of breath.   Cardiovascular: Negative for chest pain, palpitations and leg swelling.  Gastrointestinal: Negative for nausea, abdominal pain and blood in stool.  Genitourinary: Negative for dysuria and flank pain.  Musculoskeletal: Positive for back pain, arthralgias and gait problem. Negative for myalgias and joint swelling.  Skin: Negative for wound.  Neurological: Negative for dizziness, weakness, numbness and headaches.  Allergies  Imitrex  Home Medications   Current Outpatient Rx  Name Route Sig Dispense Refill  . FERROUS SULFATE 325 (65 FE) MG PO TABS Oral Take by mouth daily.      . TRAMADOL HCL 100 MG PO TB24 Oral Take by mouth as needed.      . ACETAMINOPHEN 325 MG PO TABS Oral Take 650 mg by mouth every 6 (six) hours as needed. For headache     . CYCLOBENZAPRINE HCL 10 MG PO TABS Oral Take 10 mg by mouth daily as needed. For back pain     .  PRENATAL PLUS 27-1 MG PO TABS Oral Take 1 tablet by mouth daily.        BP 143/90  Pulse 68  Temp(Src) 97.9 F (36.6 C) (Oral)  Resp 16  Ht 4\' 11"  (1.499 m)  Wt 210 lb (95.255 kg)  BMI 42.41 kg/m2  SpO2 100%  LMP 02/17/2011  Breastfeeding? No  Physical Exam  Constitutional: She is oriented to person, place, and time. No distress.  HENT:  Head: Normocephalic and atraumatic.  Mouth/Throat: Oropharynx is clear and moist. No oropharyngeal exudate.  Eyes: Conjunctivae and EOM are normal. Pupils are equal, round, and reactive to light. No scleral icterus.  Neck: Normal range of motion. Neck supple.  Cardiovascular: Normal rate, regular rhythm, normal heart sounds and intact distal pulses.  Exam reveals no gallop and no friction rub.   No murmur heard. Pulmonary/Chest: Effort normal and breath sounds normal. No respiratory distress. She has no wheezes. She has no rales. She exhibits no tenderness.  Abdominal: She exhibits no distension. There is no tenderness. There is no rebound and no guarding.  Musculoskeletal:       Left knee: She exhibits bony tenderness. She exhibits normal range of motion, no swelling, no effusion, no deformity, no laceration, no erythema, normal alignment, no LCL laxity, normal patellar mobility, normal meniscus and no MCL laxity. no tenderness found. No MCL and no LCL tenderness noted.       Lumbar back: She exhibits pain. She exhibits normal range of motion, no tenderness, no bony tenderness, no swelling, no edema, no deformity and no spasm.       Straight leg test is negative bilaterally.  Neurological: She is alert and oriented to person, place, and time. She displays normal reflexes. No cranial nerve deficit. She exhibits normal muscle tone.  Skin: Skin is warm and dry. No rash noted. She is not diaphoretic. No erythema.  Psychiatric: She has a normal mood and affect. Her behavior is normal. Judgment and thought content normal.    ED Course  Procedures  (including critical care time)  Labs Reviewed - No data to display No results found.   No diagnosis found.    MDM  Very likely an exacerbation of her chronic musculoskeletal pain. Recommended physical therapy and scheduled him tramadol and naproxen twice daily. She is to limit bending of the back and absolutely avoid bending while twisting. She is to lift with her legs and knees, not her back.  We will provide Vicodin on a when necessary basis. She was advised not to drive while taking this medication or Flexeril. She is to go see her primary care MD as soon as it is possible for further management of her back pain        Quentin Ore, MD 02/24/11 [phone]

## 2011-03-28 ENCOUNTER — Encounter (HOSPITAL_BASED_OUTPATIENT_CLINIC_OR_DEPARTMENT_OTHER): Payer: Self-pay

## 2011-03-28 ENCOUNTER — Emergency Department (HOSPITAL_BASED_OUTPATIENT_CLINIC_OR_DEPARTMENT_OTHER)
Admission: EM | Admit: 2011-03-28 | Discharge: 2011-03-28 | Disposition: A | Discharge status: Home / Self Care | Payer: Medicaid Other | Attending: Emergency Medicine | Admitting: Emergency Medicine

## 2011-03-28 DIAGNOSIS — M549 Dorsalgia, unspecified: Secondary | ICD-10-CM | POA: Insufficient documentation

## 2011-03-28 DIAGNOSIS — H109 Unspecified conjunctivitis: Secondary | ICD-10-CM | POA: Insufficient documentation

## 2011-03-28 DIAGNOSIS — J3489 Other specified disorders of nose and nasal sinuses: Secondary | ICD-10-CM | POA: Insufficient documentation

## 2011-03-28 DIAGNOSIS — J329 Chronic sinusitis, unspecified: Secondary | ICD-10-CM | POA: Insufficient documentation

## 2011-03-28 DIAGNOSIS — J45909 Unspecified asthma, uncomplicated: Secondary | ICD-10-CM | POA: Insufficient documentation

## 2011-03-28 MED ORDER — SULFACETAMIDE SODIUM 10 % OP SOLN
2.0000 [drp] | OPHTHALMIC | Status: DC
  Administered 2011-03-28: 2 [drp] via OPHTHALMIC
  Filled 2011-03-28: qty 15

## 2011-03-28 MED ORDER — HYDROCODONE-ACETAMINOPHEN 5-500 MG PO TABS: 1.0000 | ORAL_TABLET | Freq: Four times a day (QID) | ORAL | Status: AC | PRN

## 2011-03-28 MED ORDER — AMOXICILLIN 500 MG PO CAPS: 500.0000 mg | ORAL_CAPSULE | Freq: Three times a day (TID) | ORAL | Status: AC

## 2011-03-28 NOTE — ED Notes (Signed)
MD at bedside. 

## 2011-03-28 NOTE — ED Notes (Signed)
Pt reports a sore throat, nasal and head congestion, irritation to eyes, ear pain and sinus pressure x 1 week worsening the past 4 days.

## 2011-03-28 NOTE — ED Provider Notes (Signed)
History     CSN: [id number]  Arrival date & time 03/28/11  0807   First MD Initiated Contact with Patient 03/28/11 [phone]      Chief Complaint  Patient presents with  . Nasal Congestion  . Sore Throat  . Otalgia   patient is reporting sore throat, nasal congestion, eye irritation sinus pressure, and ear pain, over the past 4 days. No fevers. She states her eyes were crusted shut this morning. She's had a mild dry cough with no chest pain. No pleuritic pain. She is having diffuse sinus pressure and pain. She does have several children better at home, but none currently sick. Patient also reports a long-standing history of scoliosis and chronic back pain, currently out of her hydrocodone, requesting refill. Denies any abdominal pain, vomiting, or any urinary symptoms.  (Consider location/radiation/quality/duration/timing/severity/associated sxs/prior treatment) HPI  Past Medical History  Diagnosis Date  . Asthma   . Scoliosis   . Pancreatitis     Past Surgical History  Procedure Date  . Cesarean section   . Cholecystectomy   . Cesarean section 11/05/2010    Procedure: CESAREAN SECTION;  Surgeon: Meriel Pica;  Location: [facility];  Service: Gynecology;  Laterality: N/A;  . Tubal ligation 11/05/2010    Procedure: BILATERAL TUBAL LIGATION;  Surgeon: Meriel Pica;  Location: [facility];  Service: Gynecology;  Laterality: Bilateral;  . Tonsillectomy     No family history on file.  History  Substance Use Topics  . Smoking status: Never Smoker   . Smokeless tobacco: Not on file  . Alcohol Use: No    OB History    Grav Para Term Preterm Abortions TAB SAB Ect Mult Living   7 4 4  2 2    4       Review of Systems  All other systems reviewed and are negative.    Allergies  Imitrex  Home Medications   Current Outpatient Rx  Name Route Sig Dispense Refill  . ACETAMINOPHEN 325 MG PO TABS Oral Take 650 mg by mouth every 6 (six) hours as needed. For headache     .  CYCLOBENZAPRINE HCL 10 MG PO TABS Oral Take 10 mg by mouth daily as needed. For back pain     . FERROUS SULFATE 325 (65 FE) MG PO TABS Oral Take by mouth daily.      Marland Kitchen PRENATAL PLUS 27-1 MG PO TABS Oral Take 1 tablet by mouth daily.      . TRAMADOL HCL ER 100 MG PO TB24 Oral Take by mouth as needed.        BP 119/57  Pulse 71  Temp(Src) 98.8 F (37.1 C) (Oral)  Resp 18  Ht 4\' 11"  (1.499 m)  Wt 220 lb (99.791 kg)  BMI 44.43 kg/m2  SpO2 100%  LMP 03/07/2011  Physical Exam  Nursing note and vitals reviewed. Constitutional: She is oriented to person, place, and time. She appears well-developed and well-nourished. No distress.  HENT:  Head: Normocephalic and atraumatic.  Mouth/Throat: Oropharynx is clear and moist.       Mild bilateral clear to yellow conjunctival discharge from the eyes. Diffuse nasal congestion, and diffuse tenderness to percussion, to the bilateral maxillary sinuses. Mild erythema to the posterior throat, no abscess or odor. No adenopathy. Tympanic membranes show no redness, but mild dullness.  Eyes: Conjunctivae and EOM are normal. Pupils are equal, round, and reactive to light.  Neck: Neck supple. No thyromegaly present.  Cardiovascular: Normal rate and regular  rhythm.  Exam reveals no gallop and no friction rub.   No murmur heard. Pulmonary/Chest: Breath sounds normal. She has no wheezes. She has no rales. She exhibits no tenderness.  Abdominal: Soft. Bowel sounds are normal. She exhibits no distension. There is no tenderness. There is no rebound and no guarding.  Musculoskeletal: Normal range of motion.  Lymphadenopathy:    She has no cervical adenopathy.  Neurological: She is alert and oriented to person, place, and time. No cranial nerve deficit. Coordination normal.  Skin: Skin is warm and dry. No rash noted.  Psychiatric: She has a normal mood and affect.    ED Course  Procedures (including critical care time)  Labs Reviewed - No data to display No  results found.   No diagnosis found.    MDM  Treatment for sinusitis and conjunctivitis. Patient was given ultralight. He has 4 symptomatic care. She'll be discharged home in stable condition. Was encouraged to use meticulous handwashing to prevent the spread of the illness to her children at home. Followup primary doctor next week as needed.        Deidra Spease A. Patrica Duel, MD 03/28/11 [phone]

## 2011-08-16 IMAGING — CT CT ANGIO CHEST
2 of 6 series · 19 of 36 positions shown · IV contrast (APPLIED)
Comparison: None available.

CLINICAL DATA: Chest pain.

CT ANGIOGRAPHY CHEST WITH CONTRAST
TECHNIQUE: Multidetector CT imaging of the chest was performed
using the standard protocol during bolus administration of
intravenous contrast.  Multiplanar CT image reconstructions
including MIPs were obtained to evaluate the vascular anatomy.
Contrast:  100 ml Nmnipaque-SLL.

[Series 6: thins · axial · 0.70mm/px · z∈[-254,-43]mm · 18 of 235 slices shown]
[im 12/235  lung]
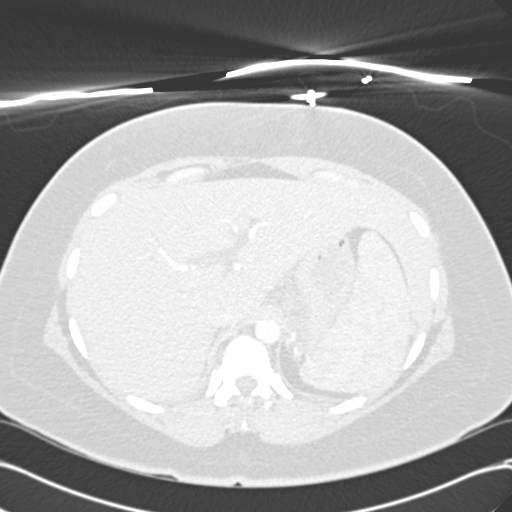
[im 24/235  mediastinal]
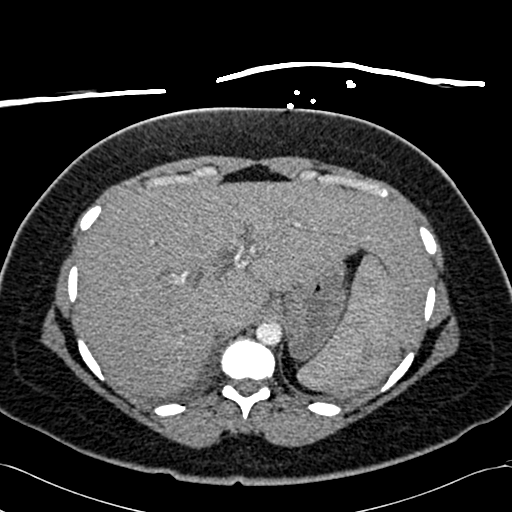
[im 36/235  lung]
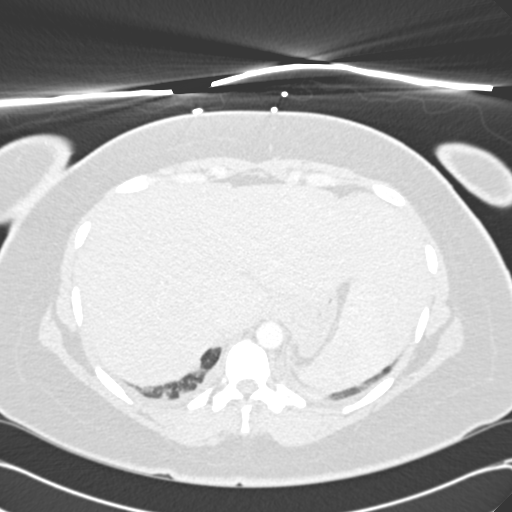
[im 47/235  mediastinal]
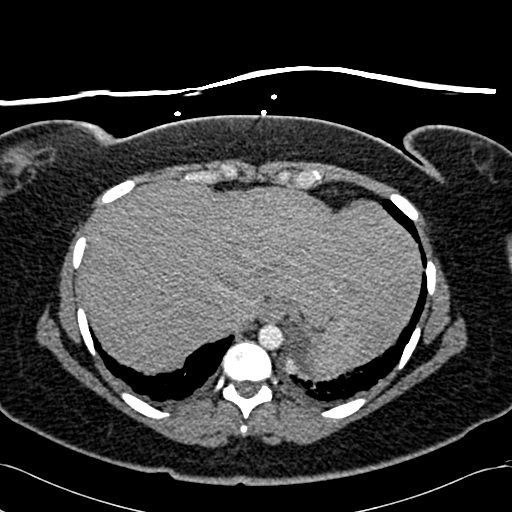
[im 59/235  lung]
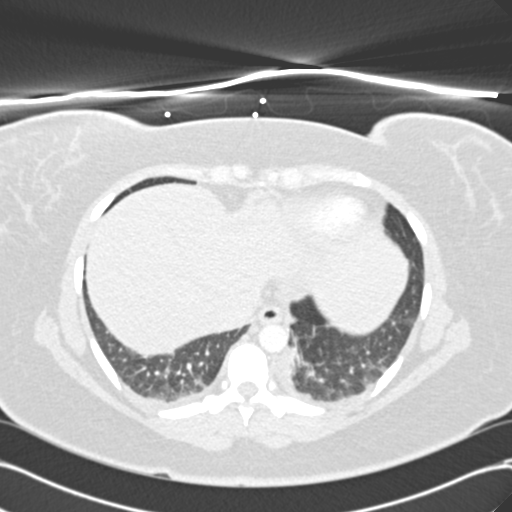
[im 71/235  mediastinal]
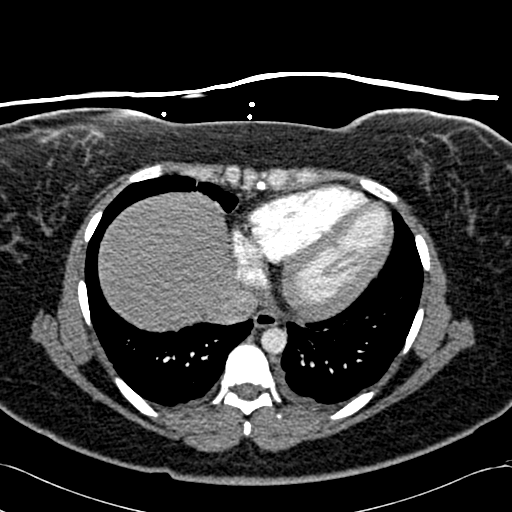
[im 82/235  lung]
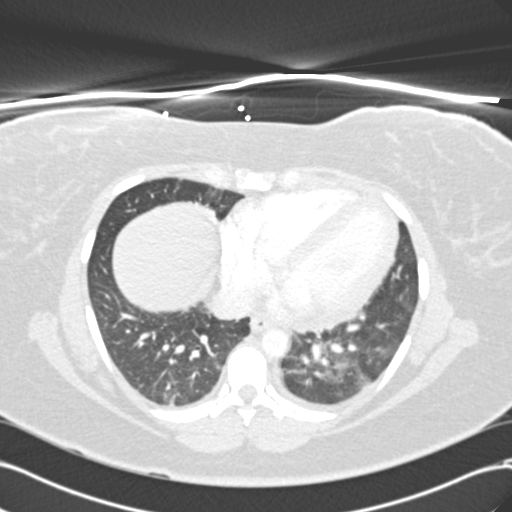
[im 94/235  mediastinal]
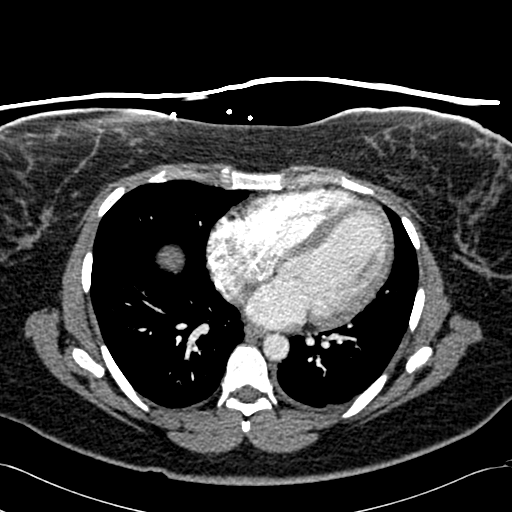
[im 106/235  lung]
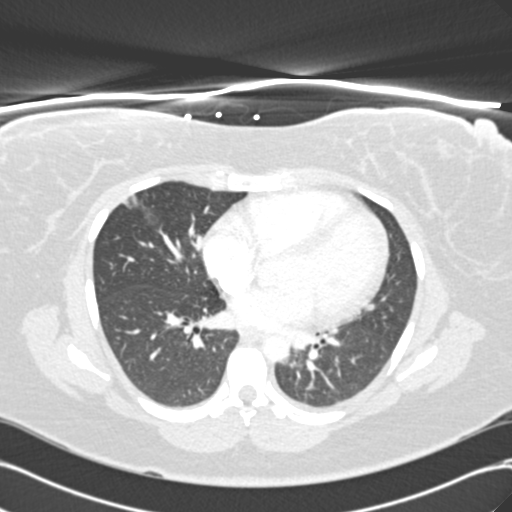
[im 129/235  mediastinal]
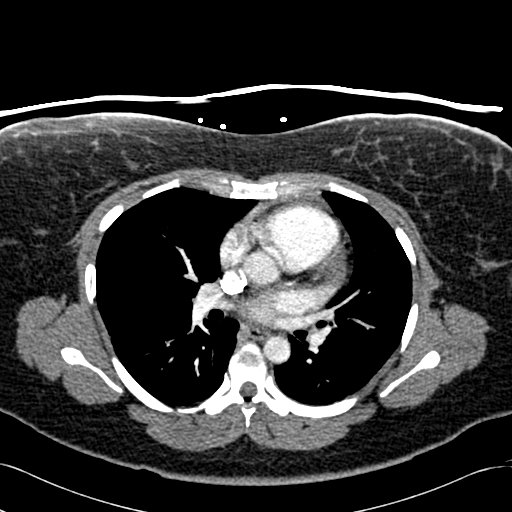
[im 141/235  lung]
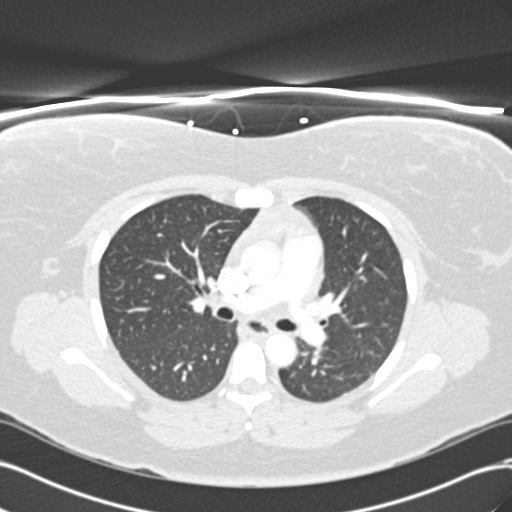
[im 153/235  mediastinal]
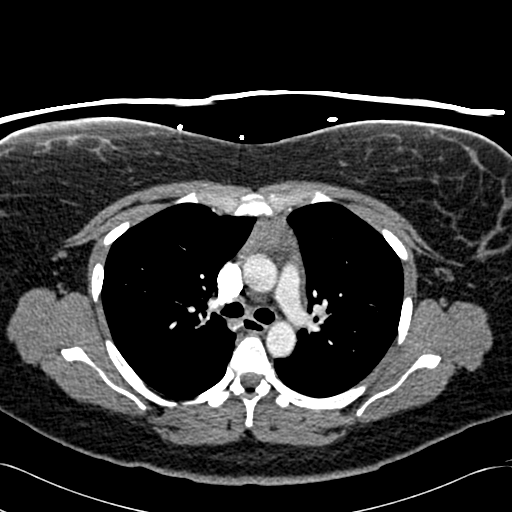
[im 164/235  lung]
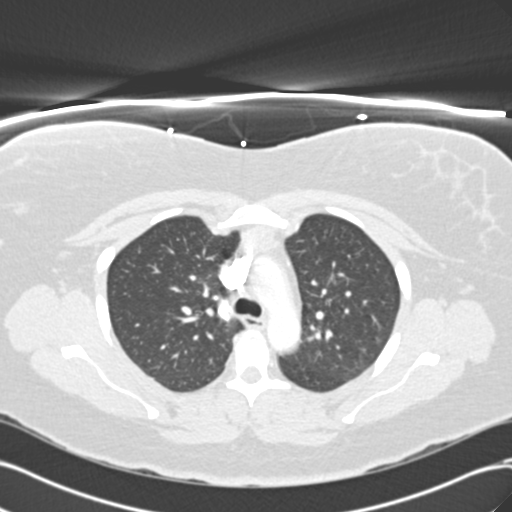
[im 176/235  mediastinal]
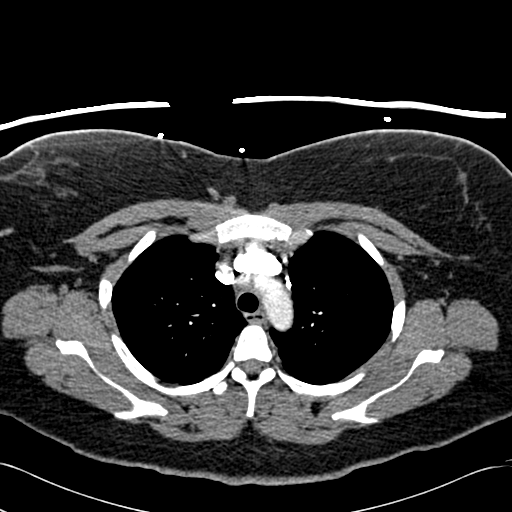
[im 188/235  lung]
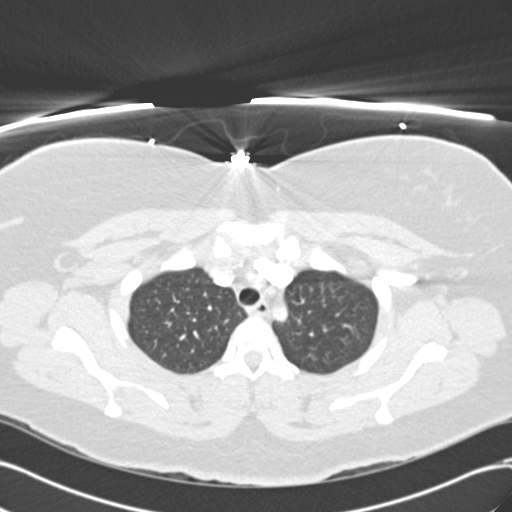
[im 199/235  mediastinal]
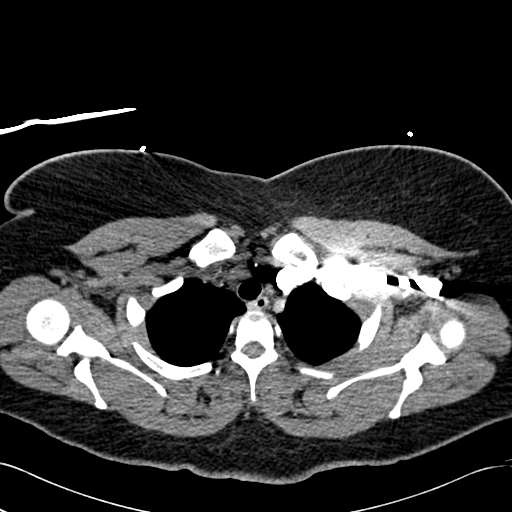
[im 211/235  lung]
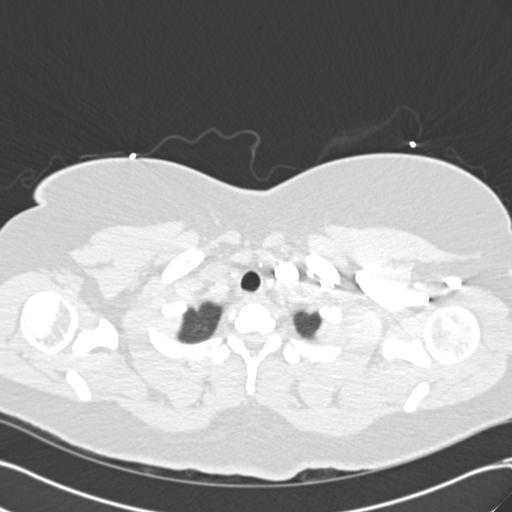
[im 223/235  mediastinal]
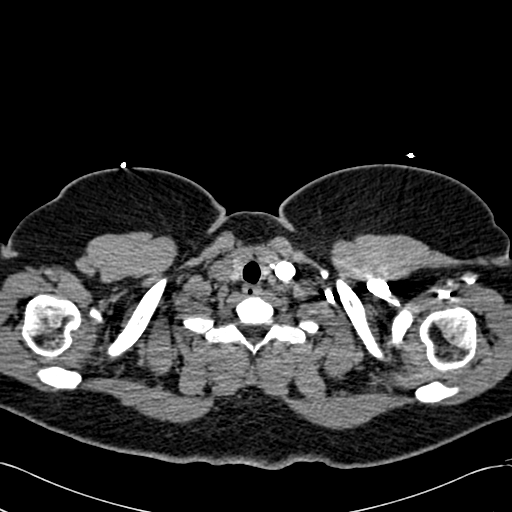

[Series 602: coronal chest · coronal · 0.70mm/px · 1 of 88 slices shown]
[im 44/88  mediastinal]
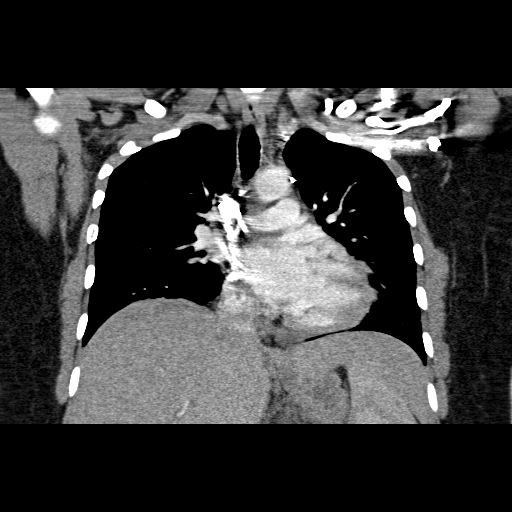

[19 of 36 positions shown; findings below may reference images not displayed]

FINDINGS: The chest wall is unremarkable.

The heart is normal in size.  No pericardial effusion.  No
mediastinal or hilar adenopathy.  The esophagus is grossly normal.
The aorta is normal in caliber.  No dissection.

The pulmonary arterial tree is fairly well opacified.  No definite
filling defects to suggest pulmonary emboli.

Examination of the lung parenchyma demonstrates no acute pulmonary
findings.  Minimal dependent bibasilar atelectasis.

Mild central intrahepatic ductal dilatation is suspected in the
upper abdomen and on the very last two cuts there appears to be an
inflammatory process in the lesser sac.  Pancreatitis would be a
possibility.  Recommend clinical correlation.  Abdominal CT may be
helpful for further evaluation if clinically indicated.

Review of the MIP images confirms the above findings.
IMPRESSION: 1.  Negative CT examination of the chest.  No findings for
pulmonary embolism.
2.  Suspect inflammatory process in the lesser sac.  Pancreatitis
would be a possibility.  Recommend clinical correlation.  Abdominal
CT may be helpful for further evaluation.

## 2011-08-21 IMAGING — RF DG CHOLANGIOGRAM OPERATIVE
1 series · 4 of 4 positions shown · non-contrast
Comparison: None

CLINICAL DATA: Gallstone pancreatitis

INTRAOPERATIVE CHOLANGIOGRAM
TECHNIQUE: Cholangiographic images from the C-arm fluoroscopic
device were submitted for interpretation post-operatively.  Please
see the procedural report for the amount of contrast and the
fluoroscopy time utilized.

[Series 1: run · 4 of 38 frames shown]
[frame 2/38]
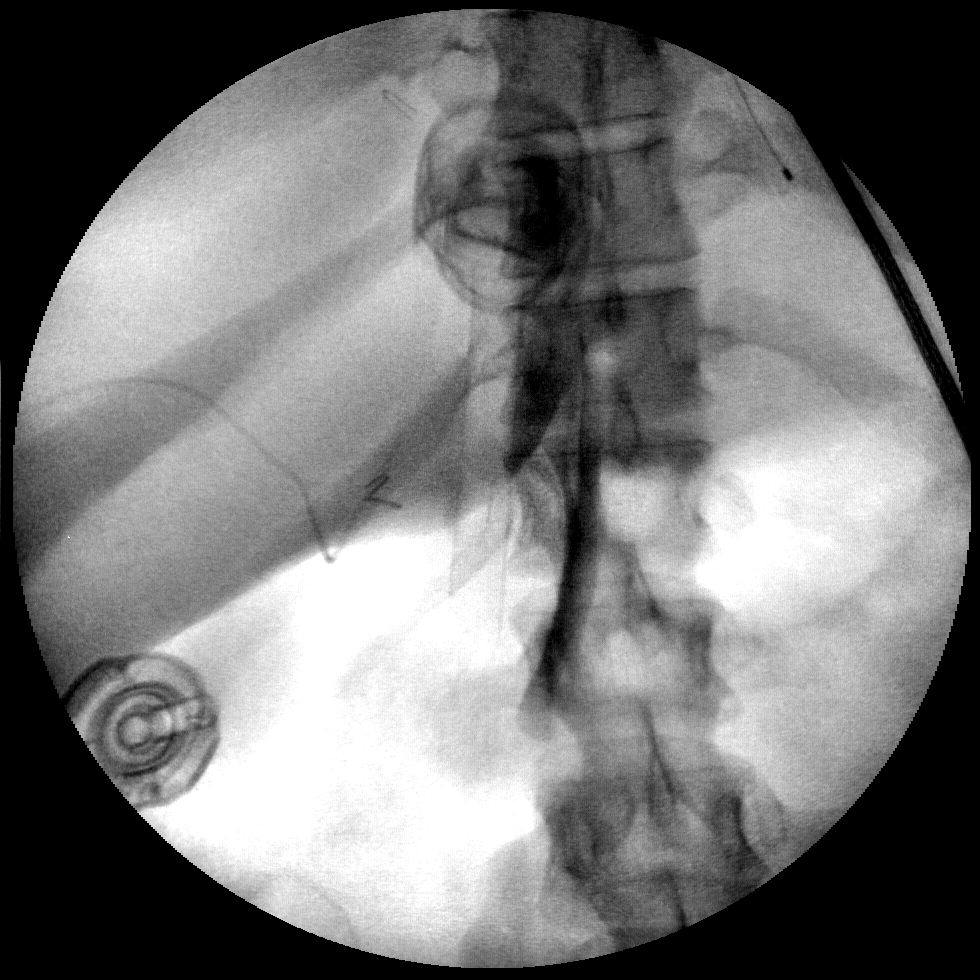
[frame 6/38]
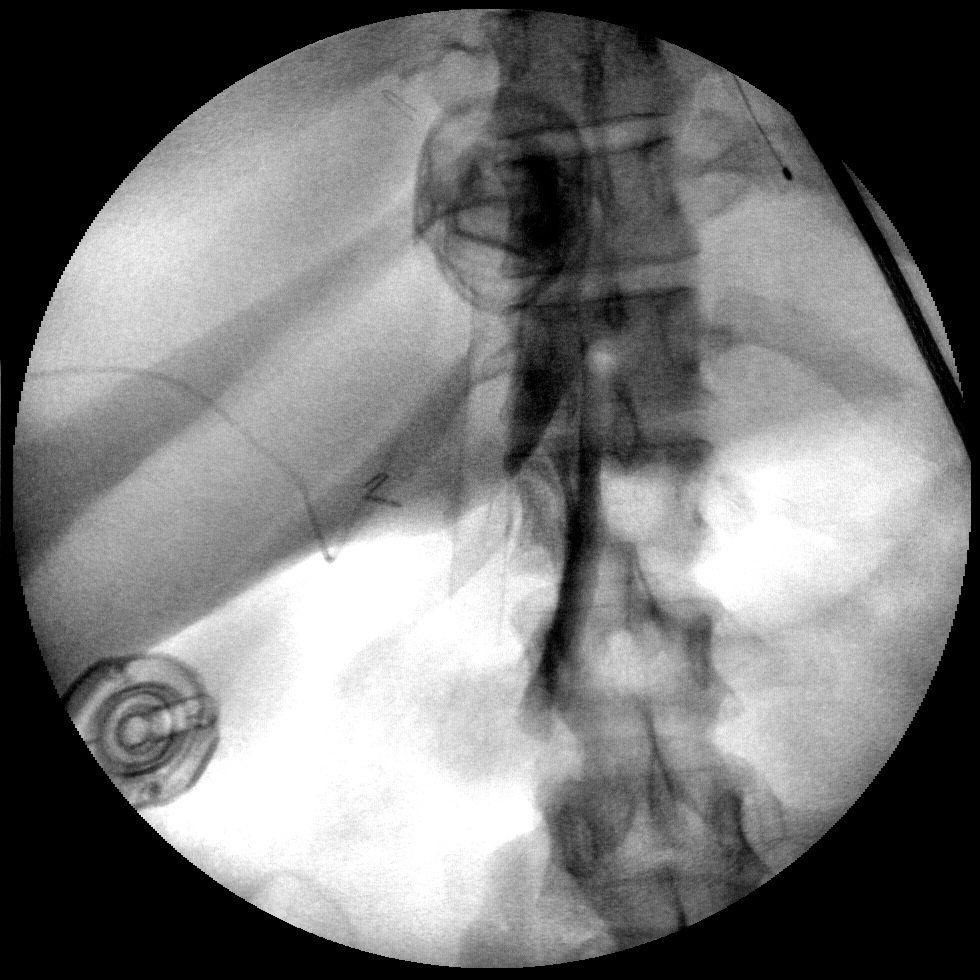
[frame 20/38]
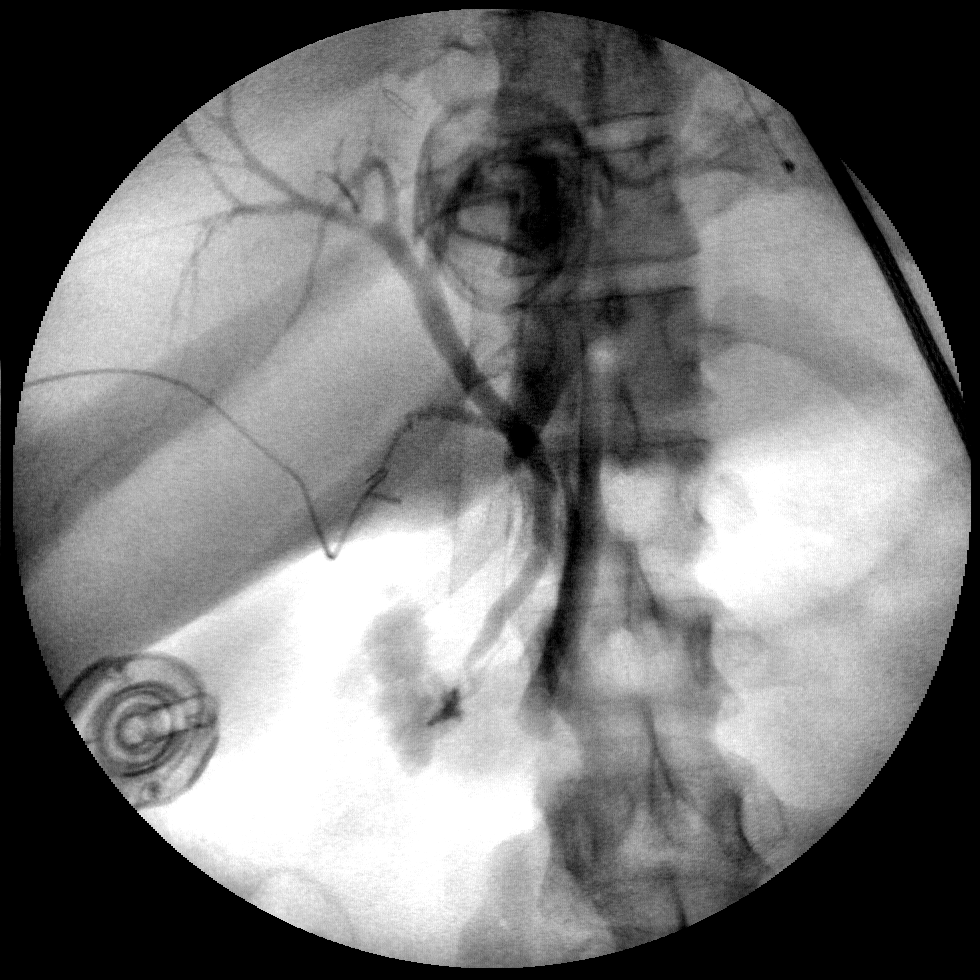
[frame 33/38]
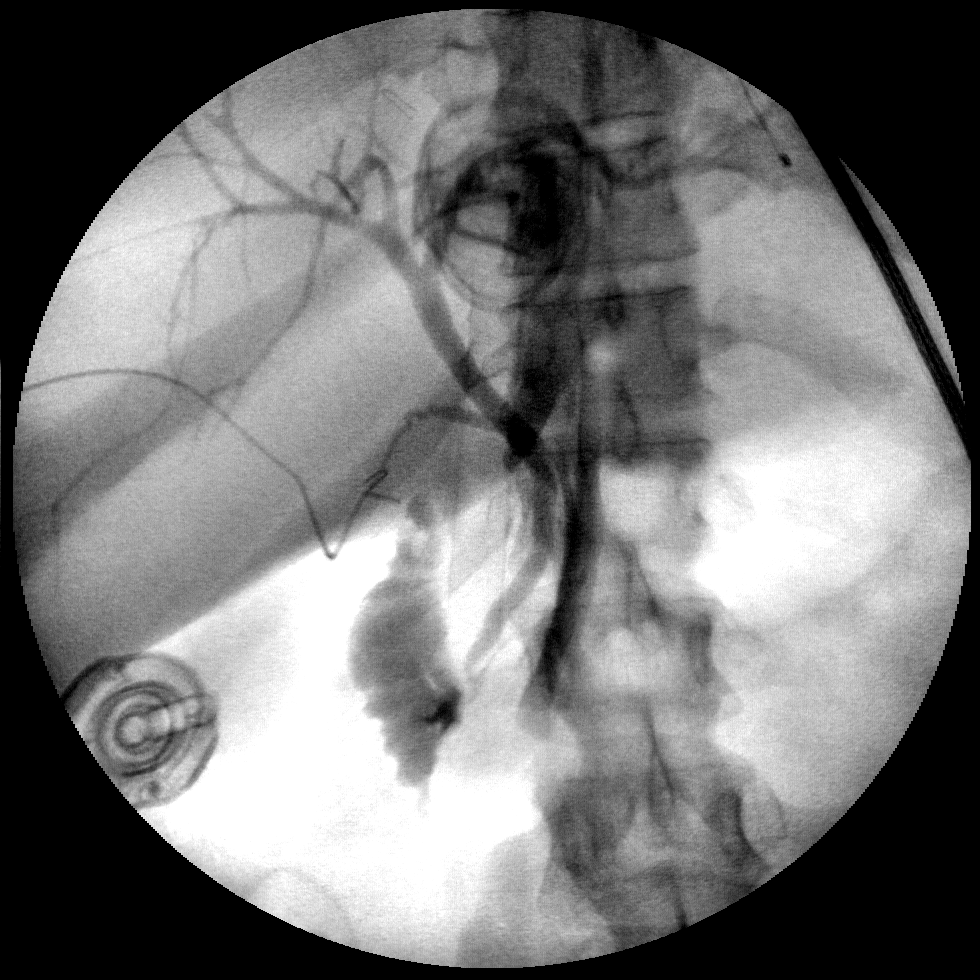

[4 of 4 positions shown; findings below may reference images not displayed]

FINDINGS: No persistent filling defects in the common duct.
Intrahepatic ducts are incompletely visualized, appearing
decompressed centrally. Contrast passes into the duodenum.

IMPRESSION

Negative for retained common duct stone.

## 2012-01-03 ENCOUNTER — Encounter (HOSPITAL_BASED_OUTPATIENT_CLINIC_OR_DEPARTMENT_OTHER): Payer: Self-pay | Admitting: *Deleted

## 2012-01-03 ENCOUNTER — Emergency Department (HOSPITAL_BASED_OUTPATIENT_CLINIC_OR_DEPARTMENT_OTHER)
Admission: EM | Admit: 2012-01-03 | Discharge: 2012-01-03 | Disposition: A | Discharge status: Home / Self Care | Payer: Medicaid Other | Attending: Emergency Medicine | Admitting: Emergency Medicine

## 2012-01-03 DIAGNOSIS — L03319 Cellulitis of trunk, unspecified: Secondary | ICD-10-CM | POA: Insufficient documentation

## 2012-01-03 DIAGNOSIS — L0390 Cellulitis, unspecified: Secondary | ICD-10-CM

## 2012-01-03 DIAGNOSIS — L02219 Cutaneous abscess of trunk, unspecified: Secondary | ICD-10-CM | POA: Insufficient documentation

## 2012-01-03 DIAGNOSIS — Z23 Encounter for immunization: Secondary | ICD-10-CM | POA: Insufficient documentation

## 2012-01-03 DIAGNOSIS — J45909 Unspecified asthma, uncomplicated: Secondary | ICD-10-CM | POA: Insufficient documentation

## 2012-01-03 MED ORDER — TETANUS-DIPHTH-ACELL PERTUSSIS 5-2.5-18.5 LF-MCG/0.5 IM SUSP
0.5000 mL | Freq: Once | INTRAMUSCULAR | Status: AC
  Administered 2012-01-03: 0.5 mL via INTRAMUSCULAR
  Filled 2012-01-03: qty 0.5

## 2012-01-03 MED ORDER — TRAMADOL HCL 50 MG PO TABS: 50.0000 mg | ORAL_TABLET | Freq: Four times a day (QID) | ORAL | Status: DC | PRN

## 2012-01-03 MED ORDER — SULFAMETHOXAZOLE-TRIMETHOPRIM 800-160 MG PO TABS: 1.0000 | ORAL_TABLET | Freq: Two times a day (BID) | ORAL | Status: DC

## 2012-01-03 NOTE — ED Provider Notes (Signed)
History  This chart was scribed for Rolan Bucco, MD by Shari Heritage. The patient was seen in room MH08/MH08. Patient's care was started at 1507.     CSN: [id number]  Arrival date & time 01/03/12  1341   First MD Initiated Contact with Patient 01/03/12 1507      Chief Complaint  Patient presents with  . Abscess     The history is provided by the patient. No language interpreter was used.    Katelyn Gray is a 36 y.o. female who presents to the Emergency Department complaining of red, painful boil to the left lower abdomen onset 5 days ago. There is associated fatigue, diaphoresis, chills, HA, sore throat, non-productive cough, and general body aches. Patient denies fever. Patient states that she first noticed a small red area several days ago and it has gradually gotten more red and painful. She thinks that she may have some kind of skin infection.Patient is taking Zoloft and Klonopin for anxiety at home. She also has a history of asthma, pancreatitis and scoliosis. Tdap status is unknown.     Past Medical History  Diagnosis Date  . Asthma   . Scoliosis   . Pancreatitis     Past Surgical History  Procedure Date  . Cesarean section   . Cholecystectomy   . Cesarean section 11/05/2010    Procedure: CESAREAN SECTION;  Surgeon: Meriel Pica;  Location: [facility];  Service: Gynecology;  Laterality: N/A;  . Tubal ligation 11/05/2010    Procedure: BILATERAL TUBAL LIGATION;  Surgeon: Meriel Pica;  Location: [facility];  Service: Gynecology;  Laterality: Bilateral;  . Tonsillectomy     History reviewed. No pertinent family history.  History  Substance Use Topics  . Smoking status: Never Smoker   . Smokeless tobacco: Not on file  . Alcohol Use: No    OB History    Grav Para Term Preterm Abortions TAB SAB Ect Mult Living   7 4 4  2 2    4       Review of Systems  Constitutional: Positive for chills, diaphoresis and fatigue. Negative for fever.  HENT: Positive for  sore throat. Negative for congestion, rhinorrhea and sneezing.   Eyes: Negative.   Respiratory: Positive for cough. Negative for chest tightness and shortness of breath.   Cardiovascular: Negative for chest pain and leg swelling.  Gastrointestinal: Negative for nausea, vomiting, abdominal pain, diarrhea and blood in stool.  Genitourinary: Negative for frequency, hematuria, flank pain and difficulty urinating.  Musculoskeletal: Negative for back pain and arthralgias.  Skin: Negative for rash.  Neurological: Positive for headaches. Negative for dizziness, speech difficulty, weakness and numbness.    Allergies  Imitrex  Home Medications   Current Outpatient Rx  Name Route Sig Dispense Refill  . CLONAZEPAM 0.5 MG PO TABS Oral Take 0.5 mg by mouth 2 (two) times daily as needed.    . OMEGA-3 FATTY ACIDS 1000 MG PO CAPS Oral Take 2 g by mouth 2 (two) times daily.    Marland Kitchen NORTRIPTYLINE HCL PO Oral Take by mouth.    . SERTRALINE HCL 100 MG PO TABS Oral Take 200 mg by mouth daily.    . ACETAMINOPHEN 325 MG PO TABS Oral Take 650 mg by mouth every 6 (six) hours as needed. For headache     . CYCLOBENZAPRINE HCL 10 MG PO TABS Oral Take 10 mg by mouth daily as needed. For back pain     . FERROUS SULFATE 325 (65 FE)  MG PO TABS Oral Take by mouth daily.      Marland Kitchen PRENATAL PLUS 27-1 MG PO TABS Oral Take 1 tablet by mouth daily.      . SULFAMETHOXAZOLE-TRIMETHOPRIM 800-160 MG PO TABS Oral Take 1 tablet by mouth every 12 (twelve) hours. 14 tablet 0    BP 120/75  Pulse 89  Temp 98 F (36.7 C) (Oral)  Resp 16  Ht 4\' 11"  (1.499 m)  Wt 218 lb (98.884 kg)  BMI 44.03 kg/m2  SpO2 98%  LMP 01/02/2012  Physical Exam  Nursing note and vitals reviewed. Constitutional: She is oriented to person, place, and time. She appears well-developed and well-nourished.  HENT:  Head: Normocephalic and atraumatic.  Right Ear: External ear normal.  Left Ear: External ear normal.  Mouth/Throat: Oropharynx is clear and  moist and mucous membranes are normal. No oropharyngeal exudate.       Throat is clear.  Eyes: Pupils are equal, round, and reactive to light.  Neck: Normal range of motion. Neck supple.  Cardiovascular: Normal rate, regular rhythm and normal heart sounds.   Pulmonary/Chest: Effort normal and breath sounds normal. No respiratory distress. She has no wheezes. She has no rales. She exhibits no tenderness.  Abdominal: Soft. Bowel sounds are normal. There is no tenderness. There is no rebound and no guarding.  Musculoskeletal: Normal range of motion. She exhibits no edema.  Lymphadenopathy:    She has no cervical adenopathy.  Neurological: She is alert and oriented to person, place, and time.  Skin: Skin is warm and dry. No rash noted.     Psychiatric: She has a normal mood and affect. Her behavior is normal.    ED Course  Procedures (including critical care time) DIAGNOSTIC STUDIES: Oxygen Saturation is 98% on room air, normal by my interpretation.    COORDINATION OF CARE: 3:14pm- Patient informed of current plan for treatment and evaluation and agrees with plan at this time.    Labs Reviewed - No data to display  No results found.   1. Cellulitis       MDM  Pt with small area of cellulitis.  No palpable underlying abscess.  Will start on abx, return in 2 days for wound check unless area is markedly better.      I personally performed the services described in this documentation, which was scribed in my presence.  The recorded information has been reviewed and considered.     Rolan Bucco, MD 01/03/12 [phone]

## 2012-01-03 NOTE — ED Notes (Signed)
Wound to abd x 5 days

## 2012-08-21 ENCOUNTER — Encounter (HOSPITAL_COMMUNITY): Payer: Self-pay | Admitting: *Deleted

## 2012-08-21 ENCOUNTER — Emergency Department (HOSPITAL_COMMUNITY)
Admission: EM | Admit: 2012-08-21 | Discharge: 2012-08-21 | Disposition: A | Discharge status: Other Acute Inpatient Hospital | Payer: Medicaid Other | Attending: Emergency Medicine | Admitting: Emergency Medicine

## 2012-08-21 DIAGNOSIS — Z3202 Encounter for pregnancy test, result negative: Secondary | ICD-10-CM | POA: Insufficient documentation

## 2012-08-21 DIAGNOSIS — F329 Major depressive disorder, single episode, unspecified: Secondary | ICD-10-CM | POA: Insufficient documentation

## 2012-08-21 DIAGNOSIS — Z8659 Personal history of other mental and behavioral disorders: Secondary | ICD-10-CM | POA: Insufficient documentation

## 2012-08-21 DIAGNOSIS — F3289 Other specified depressive episodes: Secondary | ICD-10-CM | POA: Insufficient documentation

## 2012-08-21 DIAGNOSIS — F429 Obsessive-compulsive disorder, unspecified: Secondary | ICD-10-CM | POA: Insufficient documentation

## 2012-08-21 DIAGNOSIS — E669 Obesity, unspecified: Secondary | ICD-10-CM | POA: Insufficient documentation

## 2012-08-21 DIAGNOSIS — Z79899 Other long term (current) drug therapy: Secondary | ICD-10-CM | POA: Insufficient documentation

## 2012-08-21 DIAGNOSIS — J45909 Unspecified asthma, uncomplicated: Secondary | ICD-10-CM | POA: Insufficient documentation

## 2012-08-21 DIAGNOSIS — Z8619 Personal history of other infectious and parasitic diseases: Secondary | ICD-10-CM | POA: Insufficient documentation

## 2012-08-21 DIAGNOSIS — F411 Generalized anxiety disorder: Secondary | ICD-10-CM | POA: Insufficient documentation

## 2012-08-21 DIAGNOSIS — R45851 Suicidal ideations: Secondary | ICD-10-CM | POA: Insufficient documentation

## 2012-08-21 DIAGNOSIS — Z8719 Personal history of other diseases of the digestive system: Secondary | ICD-10-CM | POA: Insufficient documentation

## 2012-08-21 DIAGNOSIS — Z791 Long term (current) use of non-steroidal anti-inflammatories (NSAID): Secondary | ICD-10-CM | POA: Insufficient documentation

## 2012-08-21 DIAGNOSIS — F17200 Nicotine dependence, unspecified, uncomplicated: Secondary | ICD-10-CM | POA: Insufficient documentation

## 2012-08-21 DIAGNOSIS — G43909 Migraine, unspecified, not intractable, without status migrainosus: Secondary | ICD-10-CM | POA: Insufficient documentation

## 2012-08-21 HISTORY — DX: Migraine, unspecified, not intractable, without status migrainosus: G43.909

## 2012-08-21 HISTORY — DX: Obsessive-compulsive disorder, unspecified: F42.9

## 2012-08-21 HISTORY — DX: Major depressive disorder, single episode, unspecified: F32.9

## 2012-08-21 HISTORY — DX: Depression, unspecified: F32.A

## 2012-08-21 HISTORY — DX: Attention-deficit hyperactivity disorder, unspecified type: F90.9

## 2012-08-21 HISTORY — DX: Anxiety disorder, unspecified: F41.9

## 2012-08-21 LAB — COMPREHENSIVE METABOLIC PANEL
ALT: 12 U/L (ref 0–35)
AST: 16 U/L (ref 0–37)
Albumin: 3.9 g/dL (ref 3.5–5.2)
Alkaline Phosphatase: 65 U/L (ref 39–117)
BUN: 21 mg/dL (ref 6–23)
CO2: 24 mEq/L (ref 19–32)
Calcium: 9.2 mg/dL (ref 8.4–10.5)
Chloride: 102 mEq/L (ref 96–112)
Creatinine, Ser: 0.7 mg/dL (ref 0.50–1.10)
GFR calc Af Amer: >90 mL/min (ref >90)
GFR calc non Af Amer: >90 mL/min (ref >90)
Glucose, Bld: 105 mg/dL — ABNORMAL HIGH (ref 70–99)
Potassium: 3.3 mEq/L — ABNORMAL LOW (ref 3.5–5.1)
Sodium: 138 mEq/L (ref 135–145)
Total Bilirubin: 0.2 mg/dL — ABNORMAL LOW (ref 0.3–1.2)
Total Protein: 7.7 g/dL (ref 6.0–8.3)

## 2012-08-21 LAB — RAPID URINE DRUG SCREEN, HOSP PERFORMED
Amphetamines: NOT DETECTED
Barbiturates: NOT DETECTED
Benzodiazepines: NOT DETECTED
Cocaine: NOT DETECTED
Opiates: NOT DETECTED
Tetrahydrocannabinol: NOT DETECTED

## 2012-08-21 LAB — CBC
HCT: 39.7 % (ref 36.0–46.0)
Hemoglobin: 13.3 g/dL (ref 12.0–15.0)
MCH: 26 pg (ref 26.0–34.0)
MCHC: 33.5 g/dL (ref 30.0–36.0)
MCV: 77.5 fL — ABNORMAL LOW (ref 78.0–100.0)
Platelets: 304 10*3/uL (ref 150–400)
RBC: 5.12 MIL/uL — ABNORMAL HIGH (ref 3.87–5.11)
RDW: 15 % (ref 11.5–15.5)
WBC: 9.7 10*3/uL (ref 4.0–10.5)

## 2012-08-21 LAB — POCT PREGNANCY, URINE: Preg Test, Ur: NEGATIVE

## 2012-08-21 LAB — ETHANOL: Alcohol, Ethyl (B): <11 mg/dL (ref 0–11)

## 2012-08-21 LAB — ACETAMINOPHEN LEVEL: Acetaminophen (Tylenol), Serum: <15 ug/mL (ref 10–30)

## 2012-08-21 LAB — SALICYLATE LEVEL: Salicylate Lvl: <2 mg/dL — ABNORMAL LOW (ref 2.8–20.0)

## 2012-08-21 MED ORDER — NICOTINE 21 MG/24HR TD PT24: 21.0000 mg | MEDICATED_PATCH | Freq: Every day | TRANSDERMAL | Status: DC

## 2012-08-21 MED ORDER — ALUM & MAG HYDROXIDE-SIMETH 200-200-20 MG/5ML PO SUSP: 30.0000 mL | ORAL | Status: DC | PRN

## 2012-08-21 MED ORDER — IBUPROFEN 600 MG PO TABS: 600.0000 mg | ORAL_TABLET | Freq: Three times a day (TID) | ORAL | Status: DC | PRN

## 2012-08-21 MED ORDER — POTASSIUM CHLORIDE CRYS ER 20 MEQ PO TBCR
40.0000 meq | EXTENDED_RELEASE_TABLET | Freq: Once | ORAL | Status: AC
  Administered 2012-08-21: 40 meq via ORAL
  Filled 2012-08-21: qty 2

## 2012-08-21 MED ORDER — ONDANSETRON HCL 4 MG PO TABS: 4.0000 mg | ORAL_TABLET | Freq: Three times a day (TID) | ORAL | Status: DC | PRN

## 2012-08-21 MED ORDER — ZOLPIDEM TARTRATE 5 MG PO TABS: 5.0000 mg | ORAL_TABLET | Freq: Every evening | ORAL | Status: DC | PRN

## 2012-08-21 MED ORDER — LORAZEPAM 1 MG PO TABS: 1.0000 mg | ORAL_TABLET | Freq: Three times a day (TID) | ORAL | Status: DC | PRN

## 2012-08-21 NOTE — ED Notes (Signed)
D/C instructions given. Belongings returned. Ambulatory without difficulty. States pain score is "tolerably, I have had a headache for 4 days." Being transported by [facility] ACT team member to [facility]. She has been accepted there for inpatient treatment.

## 2012-08-21 NOTE — ED Notes (Signed)
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

## 2012-08-21 NOTE — ED Provider Notes (Signed)
She is accepted at [facility]. Will be transported by mobile crisis team. Stable for d/c.   Richardean Canal, MD 08/21/12 Windy Fast

## 2012-08-21 NOTE — ED Notes (Signed)
Pt belongings (1bag) placed in locker 36.

## 2012-08-21 NOTE — ED Provider Notes (Signed)
History     CSN: [id number]  Arrival date & time 08/21/12  1436   First MD Initiated Contact with Patient 08/21/12 1542      Chief Complaint  Patient presents with  . Medical Clearance    (Consider location/radiation/quality/duration/timing/severity/associated sxs/prior treatment) HPI Comments: 37 year old female with a past medical history of depression presents to the emergency department complaining of worsening depression over the past 2 months. Patient states she is on 200 mg Zoloft and takes Clonopin prescribed by her primary care physician which she has been on for the past 2 years, however states her depression is worsening. Her primary care physician does not feel comfortable increasing her medicine or making any changes. States she has 5 children, ages ranging from 1-17, 1 grandchild and is also taking care of her 1 year old grandparents. Admits to having suicidal thoughts when she is laying down in bed at night including taking a whole bottle of pills as it would be the least painful.  The history is provided by the patient.    Past Medical History  Diagnosis Date  . Asthma   . Scoliosis   . Pancreatitis   . Depression   . ADHD (attention deficit hyperactivity disorder)   . OCD (obsessive compulsive disorder)   . Anxiety   . Migraines     Past Surgical History  Procedure Laterality Date  . Cesarean section    . Cholecystectomy    . Cesarean section  11/05/2010    Procedure: CESAREAN SECTION;  Surgeon: Meriel Pica;  Location: [facility];  Service: Gynecology;  Laterality: N/A;  . Tubal ligation  11/05/2010    Procedure: BILATERAL TUBAL LIGATION;  Surgeon: Meriel Pica;  Location: [facility];  Service: Gynecology;  Laterality: Bilateral;  . Tonsillectomy      No family history on file.  History  Substance Use Topics  . Smoking status: Current Some Day Smoker  . Smokeless tobacco: Never Used  . Alcohol Use: Yes    OB History   Grav Para Term Preterm  Abortions TAB SAB Ect Mult Living   7 4 4  2 2    4       Review of Systems  Psychiatric/Behavioral: Positive for suicidal ideas and dysphoric mood. Negative for hallucinations and self-injury.  All other systems reviewed and are negative.    Allergies  Imitrex  Home Medications   Current Outpatient Rx  Name  Route  Sig  Dispense  Refill  . clonazePAM (KLONOPIN) 0.5 MG tablet   Oral   Take 0.5 mg by mouth 3 (three) times daily as needed for anxiety.          . fish oil-omega-3 fatty acids 1000 MG capsule   Oral   Take 2 g by mouth 2 (two) times daily.         . naproxen sodium (ANAPROX) 220 MG tablet   Oral   Take 440 mg by mouth 2 (two) times daily with a meal.         . NORTRIPTYLINE HCL PO   Oral   Take 30 mg by mouth at bedtime.          . sertraline (ZOLOFT) 100 MG tablet   Oral   Take 200 mg by mouth daily.         . traMADol (ULTRAM) 50 MG tablet   Oral   Take 50 mg by mouth every 6 (six) hours as needed for pain.  BP 109/77  Pulse 99  Temp(Src) 97.6 F (36.4 C) (Oral)  Resp 18  SpO2 97%  LMP 08/21/2012  Physical Exam  Nursing note and vitals reviewed. Constitutional: She is oriented to person, place, and time. She appears well-developed. No distress.  Obese  HENT:  Head: Normocephalic and atraumatic.  Mouth/Throat: Oropharynx is clear and moist.  Eyes: Conjunctivae and EOM are normal. Pupils are equal, round, and reactive to light.  Neck: Normal range of motion. Neck supple.  Cardiovascular: Normal rate, regular rhythm and normal heart sounds.   Pulmonary/Chest: Effort normal and breath sounds normal.  Abdominal: Soft. Bowel sounds are normal. There is no tenderness.  Musculoskeletal: Normal range of motion. She exhibits no edema.  Neurological: She is alert and oriented to person, place, and time.  Skin: Skin is warm and dry. She is not diaphoretic.  Psychiatric: Her speech is normal and behavior is normal. She exhibits a  depressed mood. She expresses suicidal ideation. She expresses no homicidal ideation. She expresses suicidal plans.    ED Course  Procedures (including critical care time)  Labs Reviewed  CBC - Abnormal; Notable for the following:    RBC 5.12 (*)    MCV 77.5 (*)    All other components within normal limits  COMPREHENSIVE METABOLIC PANEL - Abnormal; Notable for the following:    Potassium 3.3 (*)    Glucose, Bld 105 (*)    Total Bilirubin 0.2 (*)    All other components within normal limits  SALICYLATE LEVEL - Abnormal; Notable for the following:    Salicylate Lvl <2.0 (*)    All other components within normal limits  ACETAMINOPHEN LEVEL  ETHANOL  URINE RAPID DRUG SCREEN (HOSP PERFORMED)  POCT PREGNANCY, URINE   No results found.   1. Depression       MDM  37 year old female who suicidal ideations with a plan, worsening depression. Psych labs ordered. I spoke with ACT team who states mobile crisis with a 1 who brought patient to the emergency department, I spoke with mobile crisis and they believe she needs to be evaluated and will try to find her a bed. She was accepted at [facility]. Medically cleared.      Trevor Mace, PA-C 08/21/12 [id number]

## 2012-08-21 NOTE — ED Notes (Signed)
ACT team order D/C'd due to patient being followed by [facility].

## 2012-08-21 NOTE — [facility] Assessment (Signed)
[facility] Assessment Progress Note     [facility] through [facility] is here to complete assessment and coordinate care for pt.    Pt is not ACT responsibility and care coordination and psych questions need to be addressed to [facility].  [phone]

## 2012-08-22 NOTE — ED Provider Notes (Signed)
Medical screening examination/treatment/procedure(s) were performed by non-physician practitioner and as supervising physician I was immediately available for consultation/collaboration.  Maely Clements, MD 08/22/12 1551 

## 2012-11-03 ENCOUNTER — Other Ambulatory Visit: Payer: Self-pay | Admitting: Family Medicine

## 2012-11-03 DIAGNOSIS — N644 Mastodynia: Secondary | ICD-10-CM

## 2012-11-12 ENCOUNTER — Encounter (HOSPITAL_COMMUNITY): Payer: Self-pay | Admitting: Emergency Medicine

## 2012-11-12 ENCOUNTER — Emergency Department (HOSPITAL_COMMUNITY)
Admission: EM | Admit: 2012-11-12 | Discharge: 2012-11-12 | Disposition: A | Discharge status: Home / Self Care | Payer: Medicaid Other | Attending: Emergency Medicine | Admitting: Emergency Medicine

## 2012-11-12 DIAGNOSIS — F909 Attention-deficit hyperactivity disorder, unspecified type: Secondary | ICD-10-CM | POA: Insufficient documentation

## 2012-11-12 DIAGNOSIS — G43909 Migraine, unspecified, not intractable, without status migrainosus: Secondary | ICD-10-CM | POA: Insufficient documentation

## 2012-11-12 DIAGNOSIS — Z3202 Encounter for pregnancy test, result negative: Secondary | ICD-10-CM | POA: Insufficient documentation

## 2012-11-12 DIAGNOSIS — R5381 Other malaise: Secondary | ICD-10-CM | POA: Insufficient documentation

## 2012-11-12 DIAGNOSIS — F3289 Other specified depressive episodes: Secondary | ICD-10-CM | POA: Insufficient documentation

## 2012-11-12 DIAGNOSIS — Z8719 Personal history of other diseases of the digestive system: Secondary | ICD-10-CM | POA: Insufficient documentation

## 2012-11-12 DIAGNOSIS — E860 Dehydration: Secondary | ICD-10-CM | POA: Diagnosis present

## 2012-11-12 DIAGNOSIS — R197 Diarrhea, unspecified: Secondary | ICD-10-CM | POA: Diagnosis present

## 2012-11-12 DIAGNOSIS — Z79899 Other long term (current) drug therapy: Secondary | ICD-10-CM | POA: Insufficient documentation

## 2012-11-12 DIAGNOSIS — Z8739 Personal history of other diseases of the musculoskeletal system and connective tissue: Secondary | ICD-10-CM | POA: Insufficient documentation

## 2012-11-12 DIAGNOSIS — J45909 Unspecified asthma, uncomplicated: Secondary | ICD-10-CM | POA: Insufficient documentation

## 2012-11-12 DIAGNOSIS — F17200 Nicotine dependence, unspecified, uncomplicated: Secondary | ICD-10-CM | POA: Insufficient documentation

## 2012-11-12 DIAGNOSIS — IMO0001 Reserved for inherently not codable concepts without codable children: Secondary | ICD-10-CM | POA: Insufficient documentation

## 2012-11-12 DIAGNOSIS — F411 Generalized anxiety disorder: Secondary | ICD-10-CM | POA: Insufficient documentation

## 2012-11-12 DIAGNOSIS — F329 Major depressive disorder, single episode, unspecified: Secondary | ICD-10-CM | POA: Insufficient documentation

## 2012-11-12 DIAGNOSIS — R6883 Chills (without fever): Secondary | ICD-10-CM | POA: Insufficient documentation

## 2012-11-12 DIAGNOSIS — F429 Obsessive-compulsive disorder, unspecified: Secondary | ICD-10-CM | POA: Insufficient documentation

## 2012-11-12 DIAGNOSIS — Z791 Long term (current) use of non-steroidal anti-inflammatories (NSAID): Secondary | ICD-10-CM | POA: Insufficient documentation

## 2012-11-12 DIAGNOSIS — J029 Acute pharyngitis, unspecified: Secondary | ICD-10-CM | POA: Diagnosis present

## 2012-11-12 DIAGNOSIS — R61 Generalized hyperhidrosis: Secondary | ICD-10-CM | POA: Insufficient documentation

## 2012-11-12 LAB — URINE MICROSCOPIC-ADD ON

## 2012-11-12 LAB — COMPREHENSIVE METABOLIC PANEL
ALT: 12 U/L (ref 0–35)
AST: 18 U/L (ref 0–37)
Albumin: 4.2 g/dL (ref 3.5–5.2)
Alkaline Phosphatase: 80 U/L (ref 39–117)
Calcium: 10.2 mg/dL (ref 8.4–10.5)
GFR calc Af Amer: >90 mL/min (ref >90)
Potassium: 3.6 mEq/L (ref 3.5–5.1)
Sodium: 133 mEq/L — ABNORMAL LOW (ref 135–145)
Total Protein: 8.7 g/dL — ABNORMAL HIGH (ref 6.0–8.3)

## 2012-11-12 LAB — CBC WITH DIFFERENTIAL/PLATELET
Basophils Absolute: 0 10*3/uL (ref 0.0–0.1)
Basophils Relative: 0 % (ref 0–1)
Eosinophils Absolute: 0 10*3/uL (ref 0.0–0.7)
Eosinophils Relative: 0 % (ref 0–5)
Lymphs Abs: 1.5 10*3/uL (ref 0.7–4.0)
MCH: 26.1 pg (ref 26.0–34.0)
MCHC: 34.1 g/dL (ref 30.0–36.0)
MCV: 76.7 fL — ABNORMAL LOW (ref 78.0–100.0)
Neutrophils Relative %: 76 % (ref 43–77)
Platelets: 333 10*3/uL (ref 150–400)
RBC: 6.05 MIL/uL — ABNORMAL HIGH (ref 3.87–5.11)
RDW: 15.2 % (ref 11.5–15.5)

## 2012-11-12 LAB — URINALYSIS, ROUTINE W REFLEX MICROSCOPIC
Bilirubin Urine: NEGATIVE
Hgb urine dipstick: NEGATIVE
Specific Gravity, Urine: 1.032 — ABNORMAL HIGH (ref 1.005–1.030)
Urobilinogen, UA: 0.2 mg/dL (ref 0.0–1.0)
pH: 6 (ref 5.0–8.0)

## 2012-11-12 LAB — POCT PREGNANCY, URINE: Preg Test, Ur: NEGATIVE

## 2012-11-12 MED ORDER — LOPERAMIDE HCL 2 MG PO CAPS
4.0000 mg | ORAL_CAPSULE | Freq: Once | ORAL | Status: AC
  Administered 2012-11-12: 4 mg via ORAL
  Filled 2012-11-12: qty 2

## 2012-11-12 MED ORDER — ONDANSETRON HCL 4 MG/2ML IJ SOLN
4.0000 mg | Freq: Once | INTRAMUSCULAR | Status: AC
  Administered 2012-11-12: 4 mg via INTRAVENOUS
  Filled 2012-11-12: qty 2

## 2012-11-12 MED ORDER — SODIUM CHLORIDE 0.9 % IV BOLUS (SEPSIS): 1000.0000 mL | INTRAVENOUS | Status: AC

## 2012-11-12 MED ORDER — ACETAMINOPHEN 325 MG PO TABS
650.0000 mg | ORAL_TABLET | Freq: Once | ORAL | Status: AC
  Administered 2012-11-12: 650 mg via ORAL
  Filled 2012-11-12: qty 2

## 2012-11-12 MED ORDER — SODIUM CHLORIDE 0.9 % IV BOLUS (SEPSIS)
1000.0000 mL | Freq: Once | INTRAVENOUS | Status: AC
  Administered 2012-11-12: 1000 mL via INTRAVENOUS

## 2012-11-12 MED ORDER — SODIUM CHLORIDE 0.9 % IV BOLUS (SEPSIS)
1000.0000 mL | INTRAVENOUS | Status: AC
  Administered 2012-11-12: 1000 mL via INTRAVENOUS

## 2012-11-12 NOTE — ED Notes (Addendum)
Pt states that she has sore throat, diarrhea since Thursday.  C/o gen body aches. HR 150-155 in triage.

## 2012-11-12 NOTE — ED Notes (Signed)
Pt has been to restroom multiple times and attempted to urinate but has not been able to do so

## 2012-11-12 NOTE — ED Provider Notes (Signed)
CSN: [id number]     Arrival date & time 11/12/12  1408 History     First MD Initiated Contact with Patient 11/12/12 1435     Chief Complaint  Patient presents with  . Sore Throat  . Diarrhea    Patient is a 37 y.o. female presenting with diarrhea. The history is provided by the patient.  Diarrhea Quality:  Watery Severity:  Severe Onset quality:  Gradual Duration:  3 days Timing:  Intermittent Progression:  Worsening Relieved by:  Nothing Worsened by:  Liquids Associated symptoms: chills, diaphoresis and myalgias   Associated symptoms: no abdominal pain, no fever and no vomiting   pt reports she had onset of diarrhea about 3 days ago.  It has been nonbloody . She has had multiple episodes Denies vomiting No abd pain, but reports "cramping" No active CP/SOB/cough She feels weak due to diarrhea She also reports sore throat and feels as though she "has the flu" No sick contact She had otherwise been well at her baseline prior to this episode   Past Medical History  Diagnosis Date  . Asthma   . Scoliosis   . Pancreatitis   . Depression   . ADHD (attention deficit hyperactivity disorder)   . OCD (obsessive compulsive disorder)   . Anxiety   . Migraines    Past Surgical History  Procedure Laterality Date  . Cesarean section    . Cholecystectomy    . Cesarean section  11/05/2010    Procedure: CESAREAN SECTION;  Surgeon: Meriel Pica;  Location: [facility];  Service: Gynecology;  Laterality: N/A;  . Tubal ligation  11/05/2010    Procedure: BILATERAL TUBAL LIGATION;  Surgeon: Meriel Pica;  Location: [facility];  Service: Gynecology;  Laterality: Bilateral;  . Tonsillectomy     No family history on file. History  Substance Use Topics  . Smoking status: Current Some Day Smoker  . Smokeless tobacco: Never Used  . Alcohol Use: Yes   OB History   Grav Para Term Preterm Abortions TAB SAB Ect Mult Living   7 4 4  2 2    4      Review of Systems  Constitutional:  Positive for chills and diaphoresis. Negative for fever.  HENT: Positive for sore throat.   Respiratory: Negative for shortness of breath.   Cardiovascular: Negative for chest pain.  Gastrointestinal: Positive for diarrhea. Negative for vomiting, abdominal pain and blood in stool.  Musculoskeletal: Positive for myalgias.  Neurological: Positive for weakness.    Allergies  Imitrex  Home Medications   Current Outpatient Rx  Name  Route  Sig  Dispense  Refill  . ARIPiprazole (ABILIFY) 10 MG tablet   Oral   Take 10 mg by mouth daily.         . clonazePAM (KLONOPIN) 1 MG tablet   Oral   Take 1 mg by mouth 4 (four) times daily.         . cyclobenzaprine (FLEXERIL) 10 MG tablet   Oral   Take 5-10 mg by mouth 3 (three) times daily as needed for muscle spasms.         . naproxen sodium (ANAPROX) 220 MG tablet   Oral   Take 440 mg by mouth 2 (two) times daily with a meal.         . nortriptyline (PAMELOR) 75 MG capsule   Oral   Take 75 mg by mouth at bedtime.         . pantoprazole (PROTONIX)  40 MG tablet   Oral   Take 40 mg by mouth daily.         . sertraline (ZOLOFT) 100 MG tablet   Oral   Take 200 mg by mouth daily.         Marland Kitchen topiramate (TOPAMAX) 25 MG tablet   Oral   Take 25 mg by mouth 2 (two) times daily.         . traMADol (ULTRAM) 50 MG tablet   Oral   Take 50 mg by mouth every 6 (six) hours as needed for pain.         . Vitamin D, Ergocalciferol, (DRISDOL) 50000 UNITS CAPS capsule   Oral   Take 50,000 Units by mouth every 7 (seven) days. Thursday          BP 132/82  Pulse 150  Temp(Src) 97.9 F (36.6 C) (Oral)  Resp 18  SpO2 100% Physical Exam CONSTITUTIONAL: Well developed/well nourished HEAD: Normocephalic/atraumatic EYES: EOMI/PERRL ENMT: Mucous membranes dry.  Uvula midline. Pharynx is erythematous.  Scattered whitish exudates NECK: supple no meningeal signs SPINE:entire spine nontender CV: tachycardic,S1/S2 noted, no  murmurs/rubs/gallops noted LUNGS: Lungs are clear to auscultation bilaterally, no apparent distress ABDOMEN: soft, nontender, no rebound or guarding GU:no cva tenderness NEURO: Pt is awake/alert, moves all extremitiesx4 EXTREMITIES: pulses normal, full ROM SKIN: warm, color normal PSYCH: no abnormalities of mood noted  ED Course   Procedures  Labs Reviewed  CBC WITH DIFFERENTIAL  COMPREHENSIVE METABOLIC PANEL  LIPASE, BLOOD  URINALYSIS, ROUTINE W REFLEX MICROSCOPIC   2:53 PM Pt presents for diarrhea, sore throat and myalgias She is tachycardic but is likely due to diarrhea/dehydration 3:59 PM Plan at signout to dr Romeo Apple is followup on labs and reassess vitals   MDM  Nursing notes including past medical history and social history reviewed and considered in documentation  \ Date: 11/12/2012  Rate: 126  Rhythm: sinus tachycardia  QRS Axis: right  Intervals: normal  ST/T Wave abnormalities: nonspecific ST changes  Conduction Disutrbances:none      Joya Gaskins, MD 11/12/12 1559

## 2012-11-12 NOTE — ED Notes (Signed)
Pt unable to void due her diarrhea( she thinks). Up to bathroom for frenq stools Dr Romeo Apple aware

## 2012-11-12 NOTE — ED Provider Notes (Signed)
4:03 PM Accepted care from Dr. Bebe Shaggy. 63F here w/ viral sx x 3 days. Initially tachycardic. Plan for IVF hydration, labs, symptomatic therapy and reassess w/ likely d/c home.   10:13 PM: Pt feeling much better, would like to go. Has tolerated po intake. Labs non-contrib. I have discussed the diagnosis/risks/treatment options with the patient and believe the pt to be eligible for discharge home to follow-up with pcp as needed. We also discussed returning to the ED immediately if new or worsening sx occur. We discussed the sx which are most concerning (e.g., feelings of dehydration, worsening diarrhea, bloody diarrhea) that necessitate immediate return. Any new prescriptions provided to the patient are listed below.  New Prescriptions   No medications on file   Clinical Impression 1. Diarrhea   2. Dehydration   3. Sore throat      Junius Argyle, MD 11/12/12 [phone]

## 2012-11-12 NOTE — ED Notes (Signed)
Pt attempted to urinate but was unsuccessful at this time 

## 2012-12-08 ENCOUNTER — Ambulatory Visit
Admission: RE | Admit: 2012-12-08 | Discharge: 2012-12-08 | Disposition: A | Discharge status: Home / Self Care | Payer: Medicaid Other | Source: Ambulatory Visit | Attending: Family Medicine | Admitting: Family Medicine

## 2012-12-08 ENCOUNTER — Other Ambulatory Visit: Payer: Self-pay | Admitting: Family Medicine

## 2012-12-08 DIAGNOSIS — N644 Mastodynia: Secondary | ICD-10-CM

## 2014-01-22 ENCOUNTER — Encounter (HOSPITAL_COMMUNITY): Payer: Self-pay | Admitting: Emergency Medicine

## 2016-09-30 ENCOUNTER — Other Ambulatory Visit: Payer: Self-pay | Admitting: Psychiatry

## 2016-09-30 DIAGNOSIS — G8929 Other chronic pain: Secondary | ICD-10-CM

## 2016-09-30 DIAGNOSIS — M545 Low back pain: Principal | ICD-10-CM

## 2016-10-14 ENCOUNTER — Ambulatory Visit
Admission: RE | Admit: 2016-10-14 | Discharge: 2016-10-14 | Disposition: A | Discharge status: Home / Self Care | Payer: Medicaid Other | Source: Ambulatory Visit | Attending: Psychiatry | Admitting: Psychiatry

## 2016-10-14 DIAGNOSIS — G8929 Other chronic pain: Secondary | ICD-10-CM

## 2016-10-14 DIAGNOSIS — M545 Low back pain: Principal | ICD-10-CM

## 2021-04-03 ENCOUNTER — Other Ambulatory Visit: Payer: Self-pay | Admitting: Family Medicine

## 2021-04-03 DIAGNOSIS — G500 Trigeminal neuralgia: Secondary | ICD-10-CM

## 2021-04-21 ENCOUNTER — Other Ambulatory Visit: Payer: Self-pay

## 2021-04-21 ENCOUNTER — Ambulatory Visit
Admission: RE | Admit: 2021-04-21 | Discharge: 2021-04-21 | Disposition: A | Discharge status: Home / Self Care | Payer: Medicaid Other | Source: Ambulatory Visit | Attending: Family Medicine | Admitting: Family Medicine

## 2021-04-21 DIAGNOSIS — G500 Trigeminal neuralgia: Secondary | ICD-10-CM

## 2021-04-21 MED ORDER — GADOBENATE DIMEGLUMINE 529 MG/ML IV SOLN
20.0000 mL | Freq: Once | INTRAVENOUS | Status: AC | PRN
  Administered 2021-04-21: 20 mL via INTRAVENOUS

## 2021-05-09 ENCOUNTER — Ambulatory Visit: Payer: Medicaid Other | Admitting: Psychiatry

## 2021-05-09 VITALS — BP 119/83 | HR 71 | Ht 59.0 in | Wt 238.0 lb

## 2021-05-09 DIAGNOSIS — D329 Benign neoplasm of meninges, unspecified: Secondary | ICD-10-CM

## 2021-05-09 DIAGNOSIS — G44099 Other trigeminal autonomic cephalgias (TAC), not intractable: Secondary | ICD-10-CM

## 2021-05-09 MED ORDER — INDOMETHACIN 25 MG PO CAPS: ORAL_CAPSULE | ORAL | 0 refills | Status: AC

## 2021-05-09 NOTE — Patient Instructions (Addendum)
Week  1:  -Please obtain generic omeprazole (Prilosec) (20 mg) [[employer], [employer], [employer], etc] and start one daily. You may increase to twice a day if necessary. -Please start indomethacin (indocin) 25 mg (one capsule) 3 times per day with meals. -If, in one week, you are headache-free, this is your dose, stay on it. -If, in one week, you are tolerating the indocin, but have headache, then increase the dose as below. Week 2: -Please increase indocin to 50 mg (2 capsules) 3 times per day with meals. -If, in one week, you are headache-free, this is your dose, stay on it. -If, in one week, you are tolerating the indocin, but have headache, then increase the dose as below. Weeks 3 and 4:  -Please increase indocin to 75 mg (3 capsules) 3 times per day with meals. -If, in one week, you are headache-free, this is your dose, stay on it. -If, in one week, you have partial relief, call or see her neurologist. -If, in one week, you have no relief, stop the indocin.  Repeat MRI brain in one year

## 2021-05-09 NOTE — Progress Notes (Signed)
[facility]  PATIENT: Katelyn Gray DOB: Oct 03, 1975  REFERRING CLINICIAN: Marda Stalker, PA-C HISTORY FROM: self REASON FOR VISIT: headaches, facial pain   HISTORICAL  CHIEF COMPLAINT:  Chief Complaint  Patient presents with   New Patient (Initial Visit)    Rm 1, with friend  NP/paper proficient/Courtney Rojelio Brenner at [facility] [phone]/Intermitten facial neuralgia, ? MRI change, meningioma  Reports some flare ups in the last few weeks, last major flare up was in January 2023     HISTORY OF PRESENT ILLNESS:   In December 2022 she developed right sided headaches lasting for 30 seconds to a few minutes at a time. She describes the pain as excruciating pressure and squeezing on the entire right half of her face. Headaches associated with right sided ptosis and a foreign body sensation in her eye. She had 8-10 severe pain flares per day for 3 days. Now she has constant discomfort on the right side which fluctuates in intensity but has not been as severe as her original headaches.  MRA head was normal. MRI brain showed a 1 cm left frontal meningioma  Onset: December 2022 Triggers: stress Aura: none Location: right  Quality/Description: pressure Associated Symptoms:  Photophobia: yes  Phonophobia: yes  Nausea: no +Ptosis +foreign body sensation OD Worse with activity?: yes Duration of headaches: seconds to a few minutes   OTHER MEDICAL CONDITIONS: lumbar arthritis, migraines   REVIEW OF SYSTEMS: Full 14 system review of systems performed and negative with exception of: headaches  ALLERGIES: Allergies  Allergen Reactions   Imitrex [Sumatriptan Base] Anaphylaxis    HOME MEDICATIONS: Outpatient Medications Prior to Visit  Medication Sig Dispense Refill   ARIPiprazole (ABILIFY) 10 MG tablet Take 10 mg by mouth daily.     clonazePAM (KLONOPIN) 1 MG tablet Take 1 mg by mouth 4 (four) times daily.     cyclobenzaprine  (FLEXERIL) 10 MG tablet Take 5-10 mg by mouth 3 (three) times daily as needed for muscle spasms.     naproxen sodium (ANAPROX) 220 MG tablet Take 440 mg by mouth 2 (two) times daily with a meal.     nortriptyline (PAMELOR) 75 MG capsule Take 75 mg by mouth at bedtime.     pantoprazole (PROTONIX) 40 MG tablet Take 40 mg by mouth daily.     sertraline (ZOLOFT) 100 MG tablet Take 200 mg by mouth daily.     topiramate (TOPAMAX) 25 MG tablet Take 25 mg by mouth 2 (two) times daily.     traMADol (ULTRAM) 50 MG tablet Take 50 mg by mouth every 6 (six) hours as needed for pain.     Vitamin D, Ergocalciferol, (DRISDOL) 50000 UNITS CAPS capsule Take 50,000 Units by mouth every 7 (seven) days. Thursday     No facility-administered medications prior to visit.    PAST MEDICAL HISTORY: Past Medical History:  Diagnosis Date   ADHD (attention deficit hyperactivity disorder)    Anxiety    Asthma    Depression    Migraines    OCD (obsessive compulsive disorder)    Pancreatitis    Scoliosis     PAST SURGICAL HISTORY: Past Surgical History:  Procedure Laterality Date   CESAREAN SECTION     CESAREAN SECTION  11/05/2010   Procedure: CESAREAN SECTION;  Surgeon: Margarette Asal;  Location: [facility];  Service: Gynecology;  Laterality: N/A;   CHOLECYSTECTOMY     TONSILLECTOMY     TUBAL LIGATION  11/05/2010   Procedure: BILATERAL TUBAL  LIGATION;  Surgeon: Margarette Asal;  Location: [facility];  Service: Gynecology;  Laterality: Bilateral;    FAMILY HISTORY: Mom has migraines  SOCIAL HISTORY: Social History   Socioeconomic History   Marital status: Married    Spouse name: Not on file   Number of children: Not on file   Years of education: Not on file   Highest education level: Not on file  Occupational History   Not on file  Tobacco Use   Smoking status: Some Days   Smokeless tobacco: Never  Substance and Sexual Activity   Alcohol use: Yes   Drug use: No   Sexual activity: Not on file   Other Topics Concern   Not on file  Social History Narrative   Not on file   Social Determinants of Health   Financial Resource Strain: Not on file  Food Insecurity: Not on file  Transportation Needs: Not on file  Physical Activity: Not on file  Stress: Not on file  Social Connections: Not on file  Intimate Partner Violence: Not on file     PHYSICAL EXAM  GENERAL EXAM/CONSTITUTIONAL: Vitals:  Vitals:   05/09/21 0856  BP: 119/83  Pulse: 71  Weight: 238 lb (108 kg)  Height: 4\' 11"  (1.499 m)   Body mass index is 48.07 kg/m. Wt Readings from Last 3 Encounters:  05/09/21 238 lb (108 kg)  01/03/12 218 lb (98.9 kg)  03/28/11 220 lb (99.8 kg)   Patient is in no distress; well developed, nourished and groomed; neck is supple  CARDIOVASCULAR: Examination of peripheral vascular system by observation and palpation is normal  EYES: Pupils round and reactive to light, Visual fields full to confrontation, Extraocular movements intact  MUSCULOSKELETAL: Gait, strength, tone, movements noted in Neurologic exam below  NEUROLOGIC: MENTAL STATUS:  awake, alert, oriented to person, place and time recent and remote memory intact normal attention and concentration  CRANIAL NERVE:  2nd, 3rd, 4th, 6th - pupils equal and reactive to light, visual fields full to confrontation, extraocular muscles intact, no nystagmus 5th - facial sensation symmetric 7th - facial strength symmetric 8th - hearing intact 9th - palate elevates symmetrically, uvula midline 11th - shoulder shrug symmetric 12th - tongue protrusion midline  MOTOR:  normal bulk and tone, full strength in the BUE, BLE  SENSORY:  normal and symmetric to light touch, all 4 extremities  COORDINATION:  finger-nose-finger intact bilaterally  REFLEXES:  deep tendon reflexes present and symmetric  GAIT/STATION:  normal     DIAGNOSTIC DATA (LABS, IMAGING, TESTING) - I reviewed patient records, labs, notes, testing  and imaging myself where available.  Lab Results  Component Value Date   WBC 9.1 11/12/2012   HGB 15.8 (H) 11/12/2012   HCT 46.4 (H) 11/12/2012   MCV 76.7 (L) 11/12/2012   PLT 333 11/12/2012      Component Value Date/Time   NA 133 (L) 11/12/2012 1539   K 3.6 11/12/2012 1539   CL 98 11/12/2012 1539   CO2 19 11/12/2012 1539   GLUCOSE 127 (H) 11/12/2012 1539   BUN 22 11/12/2012 1539   CREATININE 0.84 11/12/2012 1539   CALCIUM 10.2 11/12/2012 1539   PROT 8.7 (H) 11/12/2012 1539   ALBUMIN 4.2 11/12/2012 1539   AST 18 11/12/2012 1539   ALT 12 11/12/2012 1539   ALKPHOS 80 11/12/2012 1539   BILITOT 0.3 11/12/2012 1539   GFRNONAA 90 (L) 11/12/2012 1539   GFRAA >90 11/12/2012 1539   MRI brain 04/21/21 with 1 cm  left convexity meningioma. MRA unremarkable  ASSESSMENT AND PLAN  46 y.o. year old female with a history of migraines and lumbar arthritis who presents for evaluation of severe right sided headaches for the past 2 months. Headaches are associated with ipsilateral ptosis and a foreign body sensation in her right eye. Will start indomethacin trial to rule out a trigeminal autonomic cephalalgia such as hemicrania continua or paroxysmal hemicrania. Meningioma is likely an incidental finding and not the cause of her headache given small size and it is located contralateral to her headaches. Will plan to repeat imaging in 6 months-1 year to assess for stability.   No diagnosis found.    PLAN: -Indomethacin trial: 25 mg TID x1 week, then 50 mg TID x1 week, then 75 mg TID x1 week -Repeat MRI brain in 6 months to 1 year  Meds ordered this encounter  Medications   indomethacin (INDOCIN) 25 MG capsule    Sig: Take 1 capsule (25 mg total) by mouth 3 (three) times daily with meals for 7 days, THEN 2 capsules (50 mg total) 3 (three) times daily with meals for 7 days, THEN 3 capsules (75 mg total) 3 (three) times daily with meals for 7 days. Take 25 mg three times.    Dispense:  126  capsule    Refill:  0    Return in about 4 months (around 09/06/2021).    Genia Harold, MD 05/09/21 10:12 AM  I spent an average of 35 minutes chart reviewing and counseling the patient, with at least 50% of the time face to face with the patient.   [facility] [address] [place], [place] [address] [phone]

## 2021-05-21 ENCOUNTER — Other Ambulatory Visit: Payer: Self-pay | Admitting: Psychiatry

## 2021-05-22 ENCOUNTER — Ambulatory Visit: Payer: Self-pay | Admitting: Psychiatry

## 2021-09-08 ENCOUNTER — Ambulatory Visit: Payer: Medicaid Other | Admitting: Psychiatry

## 2021-09-08 ENCOUNTER — Encounter: Payer: Self-pay | Admitting: Psychiatry

## 2021-09-08 VITALS — BP 115/74 | HR 54 | Ht 59.0 in | Wt 239.0 lb

## 2021-09-08 DIAGNOSIS — G44039 Episodic paroxysmal hemicrania, not intractable: Secondary | ICD-10-CM | POA: Diagnosis not present

## 2021-09-08 NOTE — Patient Instructions (Signed)
Repeat MRI brain in September 2023

## 2021-09-08 NOTE — Progress Notes (Signed)
   CC:  headaches  Follow-up Visit  Last visit: 05/09/21  Brief HPI: 46 year old female with a history of lumbar arthritis, migraines who follows in clinic for right sided headaches with associated ptosis and foreign body sensation in right eye. MRA was normal. MRI showed a 1 cm left frontal meningioma.  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  Interval History: Her severe headaches resolved with indomethacin. She did have to take the highest dose of 75 mg TID before her headaches resolved. Tolerated this well without issues. Since completing indomethacin she has had 7-9 times where she will have 10-15 seconds of right sided pain. However these do not progress to more severe headaches and they are not associated with autonomic symptoms.  Headache days per month: 2 Headache free days per month: 28  Current Headache Regimen: Preventative: none Abortive: none  Prior Therapies                                  Indomethacin 75 mg TID - effective  Physical Exam:   Vital Signs: BP 115/74   Pulse (!) 54   Ht '4\' 11"'$  (1.499 m)   Wt 239 lb (108.4 kg)   BMI 48.27 kg/m  GENERAL:  well appearing, in no acute distress, alert  SKIN:  Color, texture, turgor normal. No rashes or lesions HEAD:  Normocephalic/atraumatic. RESP: normal respiratory effort MSK:  No gross joint deformities.   NEUROLOGICAL: Mental Status: Alert, oriented to person, place and time, Follows commands, and Speech fluent and appropriate. Cranial Nerves: PERRL, face symmetric, no dysarthria, hearing grossly intact Motor: moves all extremities equally Gait: normal-based.  IMPRESSION: 46 year old female with a history of lumbar arthritis, migraines who presents for follow up of right sided headaches. 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 She is currently doing well other than a few episodes of more mild right sided  pain lasting 10-15 seconds. Will plan to restart indomethacin if headaches worsen. Will plan to repeat MRI brain in September to monitor incidentally found meningioma.  PLAN: -Repeat MRI brain September 2023 -Will plan to restart indomethacin 75 mg TID if headaches return -Next steps: consider Lamictal   Follow-up: 6 months  I spent a total of 18 minutes on the date of the service. Headache education was done.  Discussed medication side effects, adverse reactions and drug interactions. Written educational materials and patient instructions outlining all of the above were given.  Genia Harold, MD 09/08/21 11:09 AM

## 2021-11-25 ENCOUNTER — Other Ambulatory Visit: Payer: Self-pay | Admitting: Psychiatry

## 2021-11-25 DIAGNOSIS — D329 Benign neoplasm of meninges, unspecified: Secondary | ICD-10-CM

## 2021-11-28 ENCOUNTER — Telehealth: Payer: Self-pay | Admitting: Psychiatry

## 2021-11-28 NOTE — Telephone Encounter (Signed)
[employer] medicaid Josem Kaufmann: [id number] exp. 11/28/21-10/23-23 sent to GI

## 2021-12-15 ENCOUNTER — Other Ambulatory Visit: Payer: Medicaid Other

## 2021-12-31 ENCOUNTER — Ambulatory Visit
Admission: RE | Admit: 2021-12-31 | Discharge: 2021-12-31 | Disposition: A | Discharge status: Home / Self Care | Payer: Medicaid Other | Source: Ambulatory Visit | Attending: Psychiatry | Admitting: Psychiatry

## 2021-12-31 DIAGNOSIS — D329 Benign neoplasm of meninges, unspecified: Secondary | ICD-10-CM

## 2021-12-31 MED ORDER — GADOPICLENOL 0.5 MMOL/ML IV SOLN
10.0000 mL | Freq: Once | INTRAVENOUS | Status: AC | PRN
  Administered 2021-12-31: 10 mL via INTRAVENOUS

## 2022-01-05 ENCOUNTER — Telehealth: Payer: Self-pay | Admitting: *Deleted

## 2022-01-05 NOTE — Telephone Encounter (Signed)
Called patient, informed Meningioma looks stable on MRI. We'll plan to repeat an MRI in one year for monitoring of her meningioma. Patient verbalized understanding, appreciation.

## 2022-03-03 ENCOUNTER — Other Ambulatory Visit: Payer: Self-pay | Admitting: Family Medicine

## 2022-03-03 DIAGNOSIS — N6310 Unspecified lump in the right breast, unspecified quadrant: Secondary | ICD-10-CM

## 2022-03-11 ENCOUNTER — Ambulatory Visit
Admission: RE | Admit: 2022-03-11 | Discharge: 2022-03-11 | Disposition: A | Discharge status: Home / Self Care | Payer: Medicaid Other | Source: Ambulatory Visit | Attending: Family Medicine | Admitting: Family Medicine

## 2022-03-11 ENCOUNTER — Other Ambulatory Visit: Payer: Self-pay | Admitting: Family Medicine

## 2022-03-11 DIAGNOSIS — N6310 Unspecified lump in the right breast, unspecified quadrant: Secondary | ICD-10-CM

## 2022-05-19 ENCOUNTER — Other Ambulatory Visit: Payer: Self-pay | Admitting: Sports Medicine

## 2022-05-19 DIAGNOSIS — M25561 Pain in right knee: Secondary | ICD-10-CM

## 2022-06-03 ENCOUNTER — Ambulatory Visit
Admission: RE | Admit: 2022-06-03 | Discharge: 2022-06-03 | Disposition: A | Discharge status: Home / Self Care | Payer: Medicaid Other | Source: Ambulatory Visit | Attending: Sports Medicine | Admitting: Sports Medicine

## 2022-06-03 DIAGNOSIS — M25561 Pain in right knee: Secondary | ICD-10-CM

## 2022-06-10 ENCOUNTER — Ambulatory Visit
Admission: RE | Admit: 2022-06-10 | Discharge: 2022-06-10 | Disposition: A | Discharge status: Home / Self Care | Payer: Medicaid Other | Source: Ambulatory Visit | Attending: Family Medicine | Admitting: Family Medicine

## 2022-06-10 DIAGNOSIS — N6310 Unspecified lump in the right breast, unspecified quadrant: Secondary | ICD-10-CM

## 2022-06-15 ENCOUNTER — Ambulatory Visit (INDEPENDENT_AMBULATORY_CARE_PROVIDER_SITE_OTHER): Payer: Medicaid Other | Admitting: Plastic Surgery

## 2022-06-15 VITALS — BP 115/80 | HR 60 | Ht 59.0 in | Wt 230.6 lb

## 2022-06-15 DIAGNOSIS — Z6841 Body Mass Index (BMI) 40.0 and over, adult: Secondary | ICD-10-CM | POA: Diagnosis not present

## 2022-06-15 DIAGNOSIS — M793 Panniculitis, unspecified: Secondary | ICD-10-CM

## 2022-06-15 DIAGNOSIS — R21 Rash and other nonspecific skin eruption: Secondary | ICD-10-CM

## 2022-06-16 NOTE — Progress Notes (Signed)
Referring Provider College, [facility] @ [place],  [place] [address]   CC:  Chief Complaint  Patient presents with   Consult      Katelyn Gray is an 47 y.o. female.  HPI: Katelyn Gray is a very pleasant 47 year old female who presents today to discuss a panniculectomy.  Patient states that she has ongoing rashes and and subcutaneous nodules on the posterior aspect of her pannus and in the intertriginous regions.  She is requesting that I surgically remove her pannus.  She denies any significant issues including smoking diabetes or use of blood thinners.  She endorses a 100 pound weight loss.  Allergies  Allergen Reactions   Imitrex [Sumatriptan Base] Anaphylaxis    Outpatient Encounter Medications as of 06/15/2022  Medication Sig   ibuprofen (ADVIL) 200 MG tablet Take 200 mg by mouth 3 (three) times daily.   sertraline (ZOLOFT) 100 MG tablet Take 50 mg by mouth daily.   No facility-administered encounter medications on file as of 06/15/2022.     Past Medical History:  Diagnosis Date   ADHD (attention deficit hyperactivity disorder)    Anxiety    Asthma    Depression    Migraines    OCD (obsessive compulsive disorder)    Pancreatitis    Scoliosis     Past Surgical History:  Procedure Laterality Date   CESAREAN SECTION     CESAREAN SECTION  11/05/2010   Procedure: CESAREAN SECTION;  Surgeon: Margarette Asal;  Location: [facility];  Service: Gynecology;  Laterality: N/A;   CHOLECYSTECTOMY     TONSILLECTOMY     TUBAL LIGATION  11/05/2010   Procedure: BILATERAL TUBAL LIGATION;  Surgeon: Margarette Asal;  Location: [facility];  Service: Gynecology;  Laterality: Bilateral;    Family History  Problem Relation Age of Onset   BRCA 1/2 Neg Hx    Breast cancer Neg Hx     Social History   Social History Narrative   Not on file     Review of Systems General: Denies fevers, chills, weight loss CV: Denies chest pain, shortness of breath,  palpitations Pannus: Patient has a very large pannus which extends well past her symphysis pubis and near her mid thigh.  Physical Exam    06/15/2022   10:51 AM 09/08/2021   10:42 AM 05/09/2021    8:56 AM  Vitals with BMI  Height 4\' 11"  4\' 11"  4\' 11"   Weight 230 lbs 10 oz 239 lbs 238 lbs  BMI 46.55 [id number]  Systolic [id number]  Diastolic 80 74 83  Pulse 60 54 71    General:  No acute distress,  Alert and oriented, Non-Toxic, Normal speech and affect Pannus: As noted the patient has a very large pannus with extension to her mid thigh.  She has multiple small white nodules on the posterior aspect and evidence of scarring consistent with previous rashes.   Mammogram: Patient had a mammogram in December 2023 which was BI-RADS 3.  Recommendation was made for follow-up ultrasound in 3 months.  Assessment/Plan Morbid obesity, panniculitis: I discussed with the patient the fact that I feel she should lose some weight prior to being considered for panniculectomy.  I would like for her to achieve a minimum of a BMI of 40 prior to surgery.  This is a 30 pound weight loss for her.  She is in agreement and has committed to losing the weight.  We discussed panniculectomy's at length  including the location of the incision and what portion of the abdominal wall was to be expected to be removed.  We discussed the postoperative course which would include no heavy lifting greater than 20 pounds, no vigorous activity, and no submerging the incisions in water for 6 weeks.  Photographs were obtained with her consent.  Will submit the surgical route but will delay scheduling surgery until she has met her weight loss goals.  Camillia Herter 06/16/2022, 4:48 PM

## 2022-08-24 ENCOUNTER — Ambulatory Visit: Payer: Medicaid Other | Admitting: Physician Assistant

## 2022-12-23 ENCOUNTER — Telehealth: Payer: Self-pay | Admitting: *Deleted

## 2022-12-23 NOTE — Telephone Encounter (Signed)
Dr. Delena Bali had reminder in her inbasket: "Repeat mri -meningioma"  Looks like pt last seen 09/08/21. Since it has been over a year, she will need updated visit first.   Phone room: please call pt and let her know she is due for repeat MRI but she will need to be seen first. Please schedule appointment.

## 2022-12-24 ENCOUNTER — Encounter: Payer: Self-pay | Admitting: *Deleted

## 2022-12-24 NOTE — Telephone Encounter (Signed)
Also mailed letter to pt.

## 2022-12-24 NOTE — Telephone Encounter (Signed)
After checking DPR a detailed message was left asking pt to call and schedule a follow up visit

## 2023-02-05 ENCOUNTER — Other Ambulatory Visit: Payer: Self-pay | Admitting: Family Medicine

## 2023-02-05 DIAGNOSIS — Z1231 Encounter for screening mammogram for malignant neoplasm of breast: Secondary | ICD-10-CM

## 2023-03-15 ENCOUNTER — Ambulatory Visit
Admission: RE | Admit: 2023-03-15 | Discharge: 2023-03-15 | Disposition: A | Discharge status: Home / Self Care | Payer: Medicaid Other | Source: Ambulatory Visit | Attending: Family Medicine | Admitting: Family Medicine

## 2023-03-15 DIAGNOSIS — Z1231 Encounter for screening mammogram for malignant neoplasm of breast: Secondary | ICD-10-CM

## 2023-03-23 ENCOUNTER — Other Ambulatory Visit: Payer: Self-pay | Admitting: Family Medicine

## 2023-03-23 DIAGNOSIS — R928 Other abnormal and inconclusive findings on diagnostic imaging of breast: Secondary | ICD-10-CM

## 2023-04-14 ENCOUNTER — Ambulatory Visit
Admission: RE | Admit: 2023-04-14 | Discharge: 2023-04-14 | Disposition: A | Discharge status: Home / Self Care | Payer: Medicaid Other | Source: Ambulatory Visit | Attending: Family Medicine | Admitting: Family Medicine

## 2023-04-14 ENCOUNTER — Other Ambulatory Visit: Payer: Self-pay | Admitting: Family Medicine

## 2023-04-14 DIAGNOSIS — R928 Other abnormal and inconclusive findings on diagnostic imaging of breast: Secondary | ICD-10-CM

## 2023-04-14 DIAGNOSIS — N6489 Other specified disorders of breast: Secondary | ICD-10-CM

## 2023-04-19 ENCOUNTER — Ambulatory Visit: Payer: Self-pay | Admitting: Neurology

## 2023-04-19 ENCOUNTER — Encounter: Payer: Self-pay | Admitting: Neurology

## 2023-04-19 VITALS — BP 129/71 | HR 60 | Ht 59.0 in | Wt 231.0 lb

## 2023-04-19 DIAGNOSIS — G44039 Episodic paroxysmal hemicrania, not intractable: Secondary | ICD-10-CM

## 2023-04-19 NOTE — Progress Notes (Unsigned)
Left clinic without being seen ? ?

## 2023-04-21 ENCOUNTER — Ambulatory Visit
Admission: RE | Admit: 2023-04-21 | Discharge: 2023-04-21 | Disposition: A | Discharge status: Home / Self Care | Payer: Medicaid Other | Source: Ambulatory Visit | Attending: Family Medicine | Admitting: Family Medicine

## 2023-04-21 DIAGNOSIS — N6489 Other specified disorders of breast: Secondary | ICD-10-CM

## 2023-04-21 HISTORY — PX: BREAST BIOPSY: SHX20

## 2023-04-22 LAB — SURGICAL PATHOLOGY

## 2023-06-23 ENCOUNTER — Ambulatory Visit: Payer: Medicaid Other | Admitting: Neurology

## 2023-12-09 ENCOUNTER — Ambulatory Visit (HOSPITAL_COMMUNITY): Admit: 2023-12-09 | Admitting: Orthopedic Surgery

## 2023-12-09 SURGERY — ARTHROPLASTY, KNEE, TOTAL
Anesthesia: Spinal | Site: Knee | Laterality: Right
